# Patient Record
Sex: Female | Born: 1955 | Race: Black or African American | Hispanic: No | Marital: Single | Smoking: Never smoker
Health system: Southern US, Community
[De-identification: ages and names within clinical notes are randomized; demographics above are authoritative.]

## PROBLEM LIST (undated history)

## (undated) DIAGNOSIS — E119 Type 2 diabetes mellitus without complications: Secondary | ICD-10-CM

## (undated) HISTORY — DX: Type 2 diabetes mellitus without complications: E11.9

## (undated) HISTORY — PX: BREAST BIOPSY: SHX20

## (undated) HISTORY — PX: TONSILLECTOMY: SUR1361

---

## 2002-06-07 HISTORY — PX: BREAST BIOPSY: SHX20

## 2004-10-22 ENCOUNTER — Ambulatory Visit: Payer: Self-pay | Admitting: Obstetrics and Gynecology

## 2005-11-04 ENCOUNTER — Ambulatory Visit: Payer: Self-pay | Admitting: Obstetrics and Gynecology

## 2006-10-20 ENCOUNTER — Ambulatory Visit: Payer: Self-pay | Admitting: Internal Medicine

## 2007-11-14 ENCOUNTER — Ambulatory Visit: Payer: Self-pay | Admitting: Obstetrics and Gynecology

## 2008-09-12 ENCOUNTER — Ambulatory Visit: Payer: Self-pay | Admitting: Internal Medicine

## 2008-09-16 ENCOUNTER — Ambulatory Visit: Payer: Self-pay | Admitting: Internal Medicine

## 2008-11-15 ENCOUNTER — Ambulatory Visit: Payer: Self-pay | Admitting: Obstetrics and Gynecology

## 2009-11-18 ENCOUNTER — Ambulatory Visit: Payer: Self-pay | Admitting: Obstetrics and Gynecology

## 2012-01-25 ENCOUNTER — Ambulatory Visit: Payer: Self-pay | Admitting: Obstetrics and Gynecology

## 2013-02-20 ENCOUNTER — Ambulatory Visit: Payer: Self-pay | Admitting: Obstetrics and Gynecology

## 2014-03-11 ENCOUNTER — Ambulatory Visit: Payer: Self-pay | Admitting: Obstetrics and Gynecology

## 2014-03-15 ENCOUNTER — Ambulatory Visit: Payer: Self-pay | Admitting: Obstetrics and Gynecology

## 2014-09-25 ENCOUNTER — Ambulatory Visit
Admit: 2014-09-25 | Disposition: A | Discharge status: Home / Self Care | Payer: Self-pay | Attending: Obstetrics and Gynecology | Admitting: Obstetrics and Gynecology

## 2015-03-26 ENCOUNTER — Other Ambulatory Visit: Payer: Self-pay | Admitting: Obstetrics and Gynecology

## 2015-03-26 DIAGNOSIS — R928 Other abnormal and inconclusive findings on diagnostic imaging of breast: Secondary | ICD-10-CM

## 2015-05-21 ENCOUNTER — Ambulatory Visit: Payer: Self-pay

## 2015-05-21 ENCOUNTER — Other Ambulatory Visit: Payer: Self-pay

## 2015-05-29 ENCOUNTER — Ambulatory Visit
Admission: RE | Admit: 2015-05-29 | Discharge: 2015-05-29 | Disposition: A | Discharge status: Home / Self Care | Payer: 59 | Source: Ambulatory Visit | Attending: Obstetrics and Gynecology | Admitting: Obstetrics and Gynecology

## 2015-05-29 ENCOUNTER — Other Ambulatory Visit: Payer: Self-pay | Admitting: Obstetrics and Gynecology

## 2015-05-29 DIAGNOSIS — R928 Other abnormal and inconclusive findings on diagnostic imaging of breast: Secondary | ICD-10-CM

## 2015-06-24 ENCOUNTER — Other Ambulatory Visit: Payer: Self-pay | Admitting: Internal Medicine

## 2015-06-24 DIAGNOSIS — R748 Abnormal levels of other serum enzymes: Secondary | ICD-10-CM

## 2015-07-07 ENCOUNTER — Ambulatory Visit
Admission: RE | Admit: 2015-07-07 | Discharge: 2015-07-07 | Disposition: A | Discharge status: Home / Self Care | Payer: 59 | Source: Ambulatory Visit | Attending: Internal Medicine | Admitting: Internal Medicine

## 2015-07-07 DIAGNOSIS — R748 Abnormal levels of other serum enzymes: Secondary | ICD-10-CM | POA: Insufficient documentation

## 2016-04-26 ENCOUNTER — Other Ambulatory Visit: Payer: Self-pay | Admitting: Obstetrics and Gynecology

## 2016-04-26 DIAGNOSIS — Z1231 Encounter for screening mammogram for malignant neoplasm of breast: Secondary | ICD-10-CM

## 2016-08-09 ENCOUNTER — Ambulatory Visit
Admission: RE | Admit: 2016-08-09 | Discharge: 2016-08-09 | Disposition: A | Discharge status: Home / Self Care | Payer: 59 | Source: Ambulatory Visit | Attending: Obstetrics and Gynecology | Admitting: Obstetrics and Gynecology

## 2016-08-09 DIAGNOSIS — Z1231 Encounter for screening mammogram for malignant neoplasm of breast: Secondary | ICD-10-CM | POA: Insufficient documentation

## 2017-04-26 ENCOUNTER — Other Ambulatory Visit: Payer: Self-pay | Admitting: Obstetrics and Gynecology

## 2017-04-26 DIAGNOSIS — Z1231 Encounter for screening mammogram for malignant neoplasm of breast: Secondary | ICD-10-CM

## 2017-08-10 ENCOUNTER — Ambulatory Visit
Admission: RE | Admit: 2017-08-10 | Discharge: 2017-08-10 | Disposition: A | Discharge status: Home / Self Care | Payer: 59 | Source: Ambulatory Visit | Attending: Obstetrics and Gynecology | Admitting: Obstetrics and Gynecology

## 2017-08-10 DIAGNOSIS — Z1231 Encounter for screening mammogram for malignant neoplasm of breast: Secondary | ICD-10-CM | POA: Insufficient documentation

## 2018-06-13 ENCOUNTER — Encounter: Payer: Self-pay | Admitting: *Deleted

## 2019-07-26 ENCOUNTER — Ambulatory Visit: Payer: 59

## 2019-07-30 ENCOUNTER — Ambulatory Visit: Payer: 59 | Attending: Family

## 2019-07-30 DIAGNOSIS — Z23 Encounter for immunization: Secondary | ICD-10-CM

## 2019-07-30 NOTE — Progress Notes (Signed)
   Covid-19 Vaccination Clinic  Name:  Tracy Pacheco    MRN: [id number] DOB: 1955-09-30  07/30/2019  Tracy Pacheco was observed post Covid-19 immunization for 15 minutes without incidence. She was provided with Vaccine Information Sheet and instruction to access the V-Safe system.   Tracy Pacheco was instructed to call 911 with any severe reactions post vaccine: Marland Kitchen Difficulty breathing  . Swelling of your face and throat  . A fast heartbeat  . A bad rash all over your body  . Dizziness and weakness    Immunizations Administered    Name Date Dose VIS Date Route   Moderna COVID-19 Vaccine 07/30/2019  3:45 PM 0.5 mL 05/08/2019 Intramuscular   Manufacturer: Moderna   Lot: [id number]   NDC: 41991-444-58

## 2019-09-11 ENCOUNTER — Ambulatory Visit: Payer: 59 | Attending: Family

## 2019-09-11 DIAGNOSIS — Z23 Encounter for immunization: Secondary | ICD-10-CM

## 2019-09-11 NOTE — Progress Notes (Signed)
   Covid-19 Vaccination Clinic  Name:  Tracy Pacheco    MRN: [id number] DOB: 1956/01/21  09/11/2019  Tracy Pacheco was observed post Covid-19 immunization for 15 minutes without incident. She was provided with Vaccine Information Sheet and instruction to access the V-Safe system.   Tracy Pacheco was instructed to call 911 with any severe reactions post vaccine: Marland Kitchen Difficulty breathing  . Swelling of face and throat  . A fast heartbeat  . A bad rash all over body  . Dizziness and weakness   Immunizations Administered    Name Date Dose VIS Date Route   Moderna COVID-19 Vaccine 09/11/2019  2:47 PM 0.5 mL 05/08/2019 Intramuscular   Manufacturer: Moderna   Lot: [id number]   NDC: 70488-891-69

## 2019-09-26 DIAGNOSIS — M1711 Unilateral primary osteoarthritis, right knee: Secondary | ICD-10-CM | POA: Insufficient documentation

## 2019-09-26 DIAGNOSIS — Z6841 Body Mass Index (BMI) 40.0 and over, adult: Secondary | ICD-10-CM | POA: Insufficient documentation

## 2019-11-28 ENCOUNTER — Other Ambulatory Visit: Payer: Self-pay | Admitting: Internal Medicine

## 2019-11-28 DIAGNOSIS — Z1231 Encounter for screening mammogram for malignant neoplasm of breast: Secondary | ICD-10-CM

## 2019-12-05 ENCOUNTER — Ambulatory Visit
Admission: RE | Admit: 2019-12-05 | Discharge: 2019-12-05 | Disposition: A | Discharge status: Home / Self Care | Payer: 59 | Source: Ambulatory Visit | Attending: Internal Medicine | Admitting: Internal Medicine

## 2019-12-05 DIAGNOSIS — Z1231 Encounter for screening mammogram for malignant neoplasm of breast: Secondary | ICD-10-CM | POA: Diagnosis not present

## 2020-04-18 ENCOUNTER — Ambulatory Visit: Payer: 59

## 2020-04-18 ENCOUNTER — Ambulatory Visit: Payer: Self-pay

## 2020-04-21 ENCOUNTER — Ambulatory Visit: Payer: 59 | Attending: Internal Medicine

## 2020-04-21 DIAGNOSIS — Z23 Encounter for immunization: Secondary | ICD-10-CM

## 2020-04-21 NOTE — Progress Notes (Signed)
   Covid-19 Vaccination Clinic  Name:  Tracy Pacheco    MRN: [id number] DOB: 06/26/1955  04/21/2020  Tracy Pacheco was observed post Covid-19 immunization for 15 minutes without incident. She was provided with Vaccine Information Sheet and instruction to access the V-Safe system.   Tracy Pacheco was instructed to call 911 with any severe reactions post vaccine: Marland Kitchen Difficulty breathing  . Swelling of face and throat  . A fast heartbeat  . A bad rash all over body  . Dizziness and weakness   Immunizations Administered    No immunizations on file.

## 2020-05-20 ENCOUNTER — Ambulatory Visit (INDEPENDENT_AMBULATORY_CARE_PROVIDER_SITE_OTHER): Payer: 59 | Admitting: General Surgery

## 2020-05-20 ENCOUNTER — Encounter: Payer: Self-pay | Admitting: General Surgery

## 2020-05-20 ENCOUNTER — Other Ambulatory Visit: Payer: Self-pay

## 2020-05-20 VITALS — BP 111/80 | HR 98 | Temp 98.2°F | Ht 67.0 in | Wt 334.0 lb

## 2020-05-20 DIAGNOSIS — L723 Sebaceous cyst: Secondary | ICD-10-CM | POA: Insufficient documentation

## 2020-05-20 NOTE — Patient Instructions (Signed)
We have you scheduled to have cyst removed from chin. See appointment below. Please remember to bring a list of medications to next appointment.

## 2020-05-20 NOTE — Progress Notes (Signed)
Patient ID: Tracy Pacheco, female   DOB: 1956/04/17, 64 y.o.   MRN: [id number]  Chief Complaint  Patient presents with  . New Patient (Initial Visit)    Infected cyst on chin/neck    HPI Tracy Pacheco is a 64 y.o. female.   She has been referred by her primary care provider for further evaluation of a cyst on her chin.  She says that it has been present for about a year.  It will occasionally drain clear fluid, occasionally serosanguineous fluid, and on some occasions material that looks like pus.  She took antibiotics but it did not resolve.  It is tender to the touch and the discomfort is localized just to the area of the cyst.  She denies any fevers or chills.  No nausea or vomiting.  She has had a cyst taken off her shoulder in the past but does not currently have any other cystic areas on her body.   Past Medical History:  Diagnosis Date  . Diabetes mellitus without complication [facility])     Past Surgical History:  Procedure Laterality Date  . TONSILLECTOMY      Family History  Problem Relation Age of Onset  . Cancer Father   . Breast cancer Neg Hx     Social History Social History   Tobacco Use  . Smoking status: Never Smoker  . Smokeless tobacco: Never Used  Substance Use Topics  . Alcohol use: Never  . Drug use: Never    Not on File  Current Outpatient Medications  Medication Sig Dispense Refill  . cetirizine (ZYRTEC) 5 MG tablet Take 5 mg by mouth daily.    Marland Kitchen lisinopril (ZESTRIL) 10 MG tablet Take 10 mg by mouth daily.    . metFORMIN (GLUCOPHAGE) 1000 MG tablet Take 1,000 mg by mouth 2 (two) times daily with a meal.    . Multiple Vitamin (MULTIVITAMIN) tablet Take 1 tablet by mouth daily.    . saxagliptin HCl (ONGLYZA) 5 MG TABS tablet Take by mouth daily.     No current facility-administered medications for this visit.    Review of Systems Review of Systems  All other systems reviewed and are negative.   Blood pressure 111/80, pulse 98, temperature  98.2 F (36.8 C), temperature source Oral, height 5\' 7"  (1.702 m), weight (!) 334 lb (151.5 kg), SpO2 95 %.  Physical Exam Physical Exam Vitals reviewed.  Constitutional:      General: She is not in acute distress.    Appearance: She is obese.  HENT:     Head: Normocephalic and atraumatic.      Comments: On the right side of her chin, there is an area of soft tissue swelling with a punctum on the caudal aspect.  She says this is where it drains.  It is currently not erythematous or indurated.  With palpation, I think that I can feel a contained cyst.    Nose: Nose normal.     Mouth/Throat:     Mouth: Mucous membranes are moist.     Pharynx: Oropharynx is clear.  Eyes:     General:        Right eye: No discharge.        Left eye: No discharge.  Cardiovascular:     Rate and Rhythm: Normal rate and regular rhythm.  Pulmonary:     Effort: Pulmonary effort is normal.     Breath sounds: Normal breath sounds.  Abdominal:     General: Bowel sounds are normal.  Palpations: Abdomen is soft.  Genitourinary:    Comments: Deferred Musculoskeletal:     Right lower leg: Edema present.     Left lower leg: Edema present.     Comments: 2+ nonpitting edema.  Skin:    General: Skin is warm and dry.  Neurological:     General: No focal deficit present.     Mental Status: She is alert and oriented to person, place, and time.  Psychiatric:        Mood and Affect: Mood normal.        Behavior: Behavior normal.     Data Reviewed There are no relevant outside records available for me to review.  It does appear that she has been seen by orthopedics for osteoarthritis and by podiatry for plantar fasciitis.  Unfortunately, her primary care provider does not have records accessible within epic and none were provided via fax.  Assessment This is a 64 year old woman with what appears to be a sebaceous cyst on her chin.  It has been present for a year and has not resolved with conservative  treatment.  She is interested in having it removed.  Plan I have offered her the options of an in office procedure versus 1 done in the operating room.  She would like to have it done here in clinic.  The risks of the procedure were discussed with her.  These include, but are not limited to, bleeding, infection, scarring, pain, and cyst recurrence.  She will return to clinic next week for dedicated procedure only visit.    Tracy Pacheco 05/20/2020, 10:35 AM

## 2020-05-27 ENCOUNTER — Ambulatory Visit (INDEPENDENT_AMBULATORY_CARE_PROVIDER_SITE_OTHER): Payer: 59 | Admitting: General Surgery

## 2020-05-27 ENCOUNTER — Other Ambulatory Visit: Payer: Self-pay

## 2020-05-27 ENCOUNTER — Encounter: Payer: Self-pay | Admitting: General Surgery

## 2020-05-27 VITALS — BP 154/90 | HR 105 | Temp 98.3°F | Ht 67.0 in | Wt 333.2 lb

## 2020-05-27 DIAGNOSIS — L723 Sebaceous cyst: Secondary | ICD-10-CM | POA: Diagnosis not present

## 2020-05-27 NOTE — Patient Instructions (Addendum)
Patient advised to take Ibuprofen and Tylenol or use Cold Compress (ice pack) to help with inflammation. Patient advised to avoid submerging the wound in pools of water, patient advised to pat around the wound. Steri Strips was applied to the wound at today's visit, please allow up to 10 days for them to fall off. Patient will follow up with in 2 weeks for a wound check with Dr Lady Gary.   Epidermal Cyst Removal, Care After This sheet gives you information about how to care for yourself after your procedure. Your health care provider may also give you more specific instructions. If you have problems or questions, contact your health care provider. What can I expect after the procedure? After the procedure, it is common to have:  Soreness in the area where your cyst was removed.  Tightness or itchiness from the stitches (sutures) in your skin. Follow these instructions at home: Medicines  Take over-the-counter and prescription medicines only as told by your health care provider.  If you were prescribed an antibiotic medicine or ointment, take or apply it as told by your health care provider. Do not stop using the antibiotic even if you start to feel better. Incision care   Follow instructions from your health care provider about how to take care of your incision. Make sure you: ? Wash your hands with soap and water before you change your bandage (dressing). If soap and water are not available, use hand sanitizer. ? Change your dressing as told by your health care provider. ? Leave sutures, skin glue, or adhesive strips in place. These skin closures may need to stay in place for 1-2 weeks or longer. If adhesive strip edges start to loosen and curl up, you may trim the loose edges. Do not remove adhesive strips completely unless your health care provider tells you to do that.  Keep the dressingdry until your health care provider says that it can be removed.  After your dressing is off,  check your incision area every day for signs of infection. Check for: ? Redness, swelling, or pain. ? Fluid or blood. ? Warmth. ? Pus or a bad smell. General instructions  Do not take baths, swim, or use a hot tub until your health care provider approves. Ask your health care provider if you may take showers. You may only be allowed to take sponge baths.  Your health care provider may ask you to avoid contact sports or activities that take a lot of effort. Do not do anything that stretches or puts pressure on your incision.  You can return to your normal diet.  Keep all follow-up visits as told by your health care provider. This is important. Contact a health care provider if:  You have a fever.  You have redness, swelling, or pain in the incision area.  You have fluid or blood coming from your incision.  You have pus or a bad smell coming from your incision.  Your incision feels warm to the touch.  Your cyst grows back. Summary  After the procedure, it is common to have soreness in the area where your cyst was removed.  Take or apply over-the-counter and prescription medicines only as told by your health care provider.  Follow instructions from your health care provider about how to take care of your incision. This information is not intended to replace advice given to you by your health care provider. Make sure you discuss any questions you have with your health care provider. Document Revised:  09/13/2017 Document Reviewed: 03/17/2017 Elsevier Patient Education  2020 [employer]  Wound Care, Adult Taking care of your wound properly can help to prevent pain, infection, and scarring. It can also help your wound to heal more quickly. How to care for your wound Wound care      Follow instructions from your health care provider about how to take care of your wound. Make sure you: ? Wash your hands with soap and water before you change the bandage (dressing). If soap and  water are not available, use hand sanitizer. ? Change your dressing as told by your health care provider. ? Leave stitches (sutures), skin glue, or adhesive strips in place. These skin closures may need to stay in place for 2 weeks or longer. If adhesive strip edges start to loosen and curl up, you may trim the loose edges. Do not remove adhesive strips completely unless your health care provider tells you to do that.  Check your wound area every day for signs of infection. Check for: ? Redness, swelling, or pain. ? Fluid or blood. ? Warmth. ? Pus or a bad smell.  Ask your health care provider if you should clean the wound with mild soap and water. Doing this may include: ? Using a clean towel to pat the wound dry after cleaning it. Do not rub or scrub the wound. ? Applying a cream or ointment. Do this only as told by your health care provider. ? Covering the incision with a clean dressing.  Ask your health care provider when you can leave the wound uncovered.  Keep the dressing dry until your health care provider says it can be removed. Do not take baths, swim, use a hot tub, or do anything that would put the wound underwater until your health care provider approves. Ask your health care provider if you can take showers. You may only be allowed to take sponge baths. Medicines   If you were prescribed an antibiotic medicine, cream, or ointment, take or use the antibiotic as told by your health care provider. Do not stop taking or using the antibiotic even if your condition improves.  Take over-the-counter and prescription medicines only as told by your health care provider. If you were prescribed pain medicine, take it 30 or more minutes before you do any wound care or as told by your health care provider. General instructions  Return to your normal activities as told by your health care provider. Ask your health care provider what activities are safe.  Do not scratch or pick at the  wound.  Do not use any products that contain nicotine or tobacco, such as cigarettes and e-cigarettes. These may delay wound healing. If you need help quitting, ask your health care provider.  Keep all follow-up visits as told by your health care provider. This is important.  Eat a diet that includes protein, vitamin A, vitamin C, and other nutrient-rich foods to help the wound heal. ? Foods rich in protein include meat, dairy, beans, nuts, and other sources. ? Foods rich in vitamin A include carrots and dark green, leafy vegetables. ? Foods rich in vitamin C include citrus, tomatoes, and other fruits and vegetables. ? Nutrient-rich foods have protein, carbohydrates, fat, vitamins, or minerals. Eat a variety of healthy foods including vegetables, fruits, and whole grains. Contact a health care provider if:  You received a tetanus shot and you have swelling, severe pain, redness, or bleeding at the injection site.  Your pain is not controlled  with medicine.  You have redness, swelling, or pain around the wound.  You have fluid or blood coming from the wound.  Your wound feels warm to the touch.  You have pus or a bad smell coming from the wound.  You have a fever or chills.  You are nauseous or you vomit.  You are dizzy. Get help right away if:  You have a red streak going away from your wound.  The edges of the wound open up and separate.  Your wound is bleeding, and the bleeding does not stop with gentle pressure.  You have a rash.  You faint.  You have trouble breathing. Summary  Always wash your hands with soap and water before changing your bandage (dressing).  To help with healing, eat foods that are rich in protein, vitamin A, vitamin C, and other nutrients.  Check your wound every day for signs of infection. Contact your health care provider if you suspect that your wound is infected. This information is not intended to replace advice given to you by your  health care provider. Make sure you discuss any questions you have with your health care provider. Document Revised: 09/11/2018 Document Reviewed: 12/09/2015 Elsevier Patient Education  2020 [employer].

## 2020-05-27 NOTE — Progress Notes (Signed)
Sebaceous Cyst Excision Procedure Note  Pre-operative Diagnosis: sebaceous cyst  Post-operative Diagnosis: same; 0.4cm  Locations: chin   Indications: chronically draining sebaceous cyst  Anesthesia: Lidocaine 1% with epinephrine without added sodium bicarbonate  Procedure Details  History of allergy to iodine: no  Patient informed of the risks (including bleeding and infection) and benefits of the  procedure and Written informed consent obtained.  The lesion and surrounding area was given a sterile prep using chlorhexidine and draped in the usual sterile fashion. An incision was made over the cyst, which was dissected free of the surrounding tissue and removed.  The cyst was filled with typical sebaceous material.  The wound was closed with 3-0 Vicryl using simple interrupted stitches in the deep dermis and running subcuticular 4-0 monocryl in the skin. Dermabond and Steri-Strips applied.  The specimen was not sent for pathologic examination. The patient tolerated the procedure well.  EBL: <5 ml  Findings: Deep sebaceous cyst  Condition: Stable  Complications: none.  Plan: 1. Instructed to keep the wound dry and covered for 24-48h and clean thereafter. 2. Warning signs of infection were reviewed.   3. Recommended that the patient use OTC analgesics as needed for pain.  4. Return for wound check in 2 weeks.

## 2020-06-10 ENCOUNTER — Other Ambulatory Visit: Payer: Self-pay

## 2020-06-10 ENCOUNTER — Ambulatory Visit (INDEPENDENT_AMBULATORY_CARE_PROVIDER_SITE_OTHER): Payer: 59 | Admitting: General Surgery

## 2020-06-10 ENCOUNTER — Encounter: Payer: Self-pay | Admitting: General Surgery

## 2020-06-10 VITALS — BP 161/97 | HR 94 | Temp 98.0°F | Ht 66.0 in | Wt 340.0 lb

## 2020-06-10 DIAGNOSIS — E119 Type 2 diabetes mellitus without complications: Secondary | ICD-10-CM | POA: Insufficient documentation

## 2020-06-10 DIAGNOSIS — L723 Sebaceous cyst: Secondary | ICD-10-CM

## 2020-06-10 NOTE — Patient Instructions (Signed)
Please call our office if you have questions or concerns.   

## 2020-06-10 NOTE — Progress Notes (Signed)
Tracy Pacheco is here today for a wound check.  I excised a epidermal inclusion cyst from her chin on December 21.  She says that she has been doing well.  She denies any fevers or chills.  No concern for pain.  She says there is been a small amount of serous drainage at the incision site.  Today's Vitals   06/10/20 1117  BP: (!) 161/97  Pulse: 94  Temp: 98 F (36.7 C)  TempSrc: Oral  SpO2: 95%  Weight: (!) 340 lb (154.2 kg)  Height: 5\' 6"  (1.676 m)   Body mass index is 54.88 kg/m. Focused examination demonstrates a well approximated incision with a tiny punctate opening near where the sinus tract had been.  There is no concern for infection.  No active drainage at this time.  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  It has been excised.  I reassured her that the small skin opening would close and that she could use gauze or a Band-Aid to prevent any dripping, should it occur.  I will see her on an as-needed basis.

## 2020-11-26 ENCOUNTER — Other Ambulatory Visit: Payer: Self-pay | Admitting: Internal Medicine

## 2020-11-26 DIAGNOSIS — Z1231 Encounter for screening mammogram for malignant neoplasm of breast: Secondary | ICD-10-CM

## 2020-12-05 ENCOUNTER — Other Ambulatory Visit: Payer: Self-pay

## 2020-12-05 ENCOUNTER — Ambulatory Visit
Admission: RE | Admit: 2020-12-05 | Discharge: 2020-12-05 | Disposition: A | Discharge status: Home / Self Care | Payer: 59 | Source: Ambulatory Visit | Attending: Internal Medicine | Admitting: Internal Medicine

## 2020-12-05 DIAGNOSIS — Z1231 Encounter for screening mammogram for malignant neoplasm of breast: Secondary | ICD-10-CM | POA: Diagnosis not present

## 2021-03-23 ENCOUNTER — Encounter: Payer: Self-pay | Admitting: General Surgery

## 2021-11-17 ENCOUNTER — Other Ambulatory Visit: Payer: Self-pay | Admitting: Internal Medicine

## 2021-11-17 DIAGNOSIS — Z1231 Encounter for screening mammogram for malignant neoplasm of breast: Secondary | ICD-10-CM

## 2021-11-25 ENCOUNTER — Ambulatory Visit (INDEPENDENT_AMBULATORY_CARE_PROVIDER_SITE_OTHER): Payer: 59

## 2021-11-25 ENCOUNTER — Ambulatory Visit
Admission: RE | Admit: 2021-11-25 | Discharge: 2021-11-25 | Disposition: A | Discharge status: Home / Self Care | Payer: 59 | Source: Ambulatory Visit | Attending: Emergency Medicine | Admitting: Emergency Medicine

## 2021-11-25 VITALS — BP 100/69 | HR 118 | Temp 100.5°F | Resp 18

## 2021-11-25 DIAGNOSIS — R197 Diarrhea, unspecified: Secondary | ICD-10-CM | POA: Diagnosis not present

## 2021-11-25 DIAGNOSIS — R052 Subacute cough: Secondary | ICD-10-CM | POA: Diagnosis not present

## 2021-11-25 DIAGNOSIS — R059 Cough, unspecified: Secondary | ICD-10-CM

## 2021-11-25 MED ORDER — PROMETHAZINE-DM 6.25-15 MG/5ML PO SYRP: 5.0000 mL | ORAL_SOLUTION | Freq: Four times a day (QID) | ORAL | 0 refills | Status: DC | PRN

## 2021-11-25 MED ORDER — ACETAMINOPHEN 500 MG PO TABS: 1000.0000 mg | ORAL_TABLET | Freq: Once | ORAL | Status: DC

## 2021-11-25 MED ORDER — BENZONATATE 100 MG PO CAPS: 200.0000 mg | ORAL_CAPSULE | Freq: Three times a day (TID) | ORAL | 0 refills | Status: DC

## 2021-11-25 MED ORDER — ACETAMINOPHEN 325 MG PO TABS
650.0000 mg | ORAL_TABLET | Freq: Once | ORAL | Status: AC
  Administered 2021-11-25: 650 mg via ORAL

## 2021-11-25 NOTE — ED Provider Notes (Signed)
[facility]    CSN: [id number] Arrival date & time: 11/25/21  1240      History   Chief Complaint Chief Complaint  Patient presents with   Diarrhea    Cough, chills - Entered by patient   Chills    HPI Tracy Pacheco is a 66 y.o. female.   HPI  66 year old female here for evaluation of multiple complaints.  Patient reports that she has been experiencing a cough for the last 2 weeks that had been dry but is now starting to have some yellow mucus production.  This is associated with some mild nasal congestion but she denies any nasal discharge, sore throat, or ear pain.  5 days ago she developed diarrhea and chills.  This is not associated with any nausea or vomiting.  She has had a decreased appetite.  Patient also denies any shortness of breath or wheezing.  She did take a home COVID test which was negative.  She denies any abdominal pain.  Past Medical History:  Diagnosis Date   Diabetes mellitus without complication [facility])     Patient Active Problem List   Diagnosis Date Noted   Diabetes mellitus type 2, uncomplicated ([place]) 123XX123   Sebaceous cyst 05/20/2020   Morbid obesity with BMI of 50.0-59.9, adult ([place]) 09/26/2019   Primary osteoarthritis of right knee 09/26/2019    Past Surgical History:  Procedure Laterality Date   TONSILLECTOMY      OB History   No obstetric history on file.      Home Medications    Prior to Admission medications   Medication Sig Start Date End Date Taking? Authorizing Provider  benzonatate (TESSALON) 100 MG capsule Take 2 capsules (200 mg total) by mouth every 8 (eight) hours. 11/25/21  Yes Margarette Canada, NP  promethazine-dextromethorphan (PROMETHAZINE-DM) 6.25-15 MG/5ML syrup Take 5 mLs by mouth 4 (four) times daily as needed. 11/25/21  Yes Margarette Canada, NP  atorvastatin (LIPITOR) 10 MG tablet Take 10 mg by mouth daily. 05/06/20   [provider]  cetirizine (ZYRTEC) 5 MG tablet Take 5 mg by mouth daily.     [provider]  Cholecalciferol 25 MCG (1000 UT) tablet Take by mouth.    [provider]  hydrochlorothiazide (HYDRODIURIL) 25 MG tablet Take 25 mg by mouth daily. 04/14/20   [provider]  lisinopril (ZESTRIL) 10 MG tablet Take 20 mg by mouth daily.    [provider]  meloxicam (MOBIC) 15 MG tablet Take 15 mg by mouth daily. 05/16/20   [provider]  metFORMIN (GLUCOPHAGE) 1000 MG tablet Take 1,000 mg by mouth 2 (two) times daily with a meal.    [provider]  Multiple Vitamin (MULTIVITAMIN) tablet Take 1 tablet by mouth daily.    [provider]  OneTouch Delica Lancets [id number] MISC  04/17/20   [provider]  [facility] VERIO test strip 1 each daily. 03/31/20   [provider]  saxagliptin HCl (ONGLYZA) 5 MG TABS tablet Take by mouth daily.    [provider]    Family History Family History  Problem Relation Age of Onset   Cancer Father    Breast cancer Neg Hx     Social History Social History   Tobacco Use   Smoking status: Never   Smokeless tobacco: Never  Substance Use Topics   Alcohol use: Never   Drug use: Never     Allergies   Patient has no known allergies.   Review of Systems  Review of Systems  Constitutional:  Positive for chills. Negative for fever.  HENT:  Positive for congestion. Negative for ear pain, rhinorrhea and sore throat.   Respiratory:  Positive for cough. Negative for shortness of breath and wheezing.   Gastrointestinal:  Positive for diarrhea. Negative for abdominal pain, nausea and vomiting.  Skin:  Negative for rash.  Hematological: Negative.   Psychiatric/Behavioral: Negative.       Physical Exam Triage Vital Signs ED Triage Vitals  Enc Vitals Group     BP      Pulse      Resp      Temp      Temp src      SpO2      Weight      Height      Head Circumference      Peak Flow      Pain Score      Pain Loc      Pain Edu?      Excl. in  GC?    No data found.  Updated Vital Signs BP 100/69 (BP Location: Right Arm)   Pulse (!) 118   Temp (!) 100.5 F (38.1 C) (Oral)   Resp 18   SpO2 94%   Visual Acuity Right Eye Distance:   Left Eye Distance:   Bilateral Distance:    Right Eye Near:   Left Eye Near:    Bilateral Near:     Physical Exam Vitals and nursing note reviewed.  Constitutional:      Appearance: Normal appearance. She is not ill-appearing.  HENT:     Head: Normocephalic and atraumatic.     Right Ear: Tympanic membrane, ear canal and external ear normal. There is no impacted cerumen.     Left Ear: Tympanic membrane, ear canal and external ear normal. There is no impacted cerumen.     Nose: Nose normal. No congestion or rhinorrhea.     Mouth/Throat:     Mouth: Mucous membranes are moist.     Pharynx: Oropharynx is clear. No oropharyngeal exudate or posterior oropharyngeal erythema.  Cardiovascular:     Rate and Rhythm: Normal rate and regular rhythm.     Pulses: Normal pulses.     Heart sounds: Normal heart sounds. No murmur heard.    No friction rub. No gallop.  Pulmonary:     Effort: Pulmonary effort is normal.     Breath sounds: Normal breath sounds. No wheezing, rhonchi or rales.  Musculoskeletal:     Cervical back: Normal range of motion and neck supple.  Lymphadenopathy:     Cervical: No cervical adenopathy.  Skin:    General: Skin is warm and dry.     Capillary Refill: Capillary refill takes less than 2 seconds.     Findings: No erythema or rash.  Neurological:     General: No focal deficit present.     Mental Status: She is alert and oriented to person, place, and time.  Psychiatric:        Mood and Affect: Mood normal.        Behavior: Behavior normal.        Thought Content: Thought content normal.        Judgment: Judgment normal.      UC Treatments / Results  Labs (all labs ordered are listed, but only abnormal results are displayed) Labs Reviewed - No data to  display  EKG   Radiology DG Chest 2 View  Result Date: 11/25/2021  CLINICAL DATA:  Productive cough EXAM: CHEST - 2 VIEW COMPARISON:  None Available. FINDINGS: The heart size and mediastinal contours are within normal limits. Both lungs are clear. The visualized skeletal structures are unremarkable. IMPRESSION: No active cardiopulmonary disease. Electronically Signed   By: Ernie Avena M.D.   On: 11/25/2021 13:33    Procedures Procedures (including critical care time)  Medications Ordered in UC Medications  acetaminophen (TYLENOL) tablet 1,000 mg (1,000 mg Oral Not Given 11/25/21 1323)  acetaminophen (TYLENOL) tablet 650 mg (650 mg Oral Given 11/25/21 1323)    Initial Impression / Assessment and Plan / UC Course  I have reviewed the triage vital signs and the nursing notes.  Pertinent labs & imaging results that were available during my care of the patient were reviewed by me and considered in my medical decision making (see chart for details).  Patient is a nontoxic-appearing 66 year old female here for evaluation of cough, diarrhea, and chills as outlined in HPI above.  Patient reports that she has been checking at home but has not registered a fever.  Today in clinic she is 100.5.  She has had a nonproductive cough for 2 weeks that has in the last 3 days turned into a productive cough for yellow sputum.  No associated shortness of breath or wheezing.  Patient also denies any other upper respiratory symptoms.  5 days ago she developed diarrhea and chills along with a decreased appetite.  She states when she does eat something it goes right through her and she has to have a bowel movement within a few minutes of eating.  This is not associated with abdominal pain, nausea, or vomiting.  On exam patient has pearly-gray tympanic membranes bilaterally with normal light reflex and clear external auditory canals.  Nasal mucosa is pink and moist without erythema, edema, or discharge.   Oropharyngeal exam is benign.  No cervical of adenopathy appreciable exam.  Cardiopulmonary exam reveals clear lung sounds in all fields.  I will obtain chest x-ray to look for the presence of pneumonia.  I am not going to swab the patient for COVID or influenza as she is outside the window for treatment of either.  Radiology impression of chest x-ray states negative for acute cardiopulmonary process.  I reexamined the patient and patient Isabella Stalling that she is having 6-8 diarrheal stools a day but denies any blood.  No belly pain.  Patient's abdomen is protuberant but is soft and nontender to touch.  I will discharge patient home with Tessalon Perles and Promethazine DM cough syrup to help with cough during the day and at night and have her use over-the-counter Imodium as needed for the diarrhea.  Any new or worsening symptoms patient should return for reevaluation.   Final Clinical Impressions(s) / UC Diagnoses   Final diagnoses:  Subacute cough  Diarrhea, unspecified type     Discharge Instructions      Your chest x-ray today did not demonstrate the presence of any pneumonia.  I believe your diarrhea is coming as a result of an infectious process in your body but I do not have an etiology for that.  Use the Tessalon Perles every hour during the day as needed for cough and use the Promethazine DM cough syrup at bedtime.  Take over-the-counter Imodium according to package instructions as needed for diarrhea.  If you develop any new or worsening symptoms please return for reevaluation or see your primary care provider.     ED Prescriptions  Medication Sig Dispense Auth. Provider   benzonatate (TESSALON) 100 MG capsule Take 2 capsules (200 mg total) by mouth every 8 (eight) hours. 21 capsule Margarette Canada, NP   promethazine-dextromethorphan (PROMETHAZINE-DM) 6.25-15 MG/5ML syrup Take 5 mLs by mouth 4 (four) times daily as needed. 118 mL Margarette Canada, NP      PDMP not reviewed this  encounter.   Margarette Canada, NP 11/25/21 1343

## 2021-11-25 NOTE — Discharge Instructions (Addendum)
Your chest x-ray today did not demonstrate the presence of any pneumonia.  I believe your diarrhea is coming as a result of an infectious process in your body but I do not have an etiology for that.  Use the Tessalon Perles every hour during the day as needed for cough and use the Promethazine DM cough syrup at bedtime.  Take over-the-counter Imodium according to package instructions as needed for diarrhea.  If you develop any new or worsening symptoms please return for reevaluation or see your primary care provider.

## 2021-11-25 NOTE — ED Triage Notes (Signed)
Pt reports a cough for a couple weeks, diarrhea since Friday and chills since Friday night. States tried pepto bismal and imodium with no relief. Took home covid test and it was neg.

## 2021-12-11 ENCOUNTER — Ambulatory Visit
Admission: RE | Admit: 2021-12-11 | Discharge: 2021-12-11 | Disposition: A | Discharge status: Home / Self Care | Payer: 59 | Source: Ambulatory Visit | Attending: Internal Medicine | Admitting: Internal Medicine

## 2021-12-11 DIAGNOSIS — Z1231 Encounter for screening mammogram for malignant neoplasm of breast: Secondary | ICD-10-CM | POA: Diagnosis present

## 2022-03-05 ENCOUNTER — Other Ambulatory Visit: Payer: Self-pay | Admitting: Gastroenterology

## 2022-03-05 DIAGNOSIS — R748 Abnormal levels of other serum enzymes: Secondary | ICD-10-CM

## 2022-03-10 ENCOUNTER — Ambulatory Visit
Admission: RE | Admit: 2022-03-10 | Discharge: 2022-03-10 | Disposition: A | Discharge status: Home / Self Care | Payer: 59 | Source: Ambulatory Visit | Attending: Gastroenterology | Admitting: Gastroenterology

## 2022-03-10 DIAGNOSIS — R748 Abnormal levels of other serum enzymes: Secondary | ICD-10-CM

## 2022-04-22 ENCOUNTER — Other Ambulatory Visit: Payer: Self-pay | Admitting: Obstetrics and Gynecology

## 2022-04-22 DIAGNOSIS — Z1231 Encounter for screening mammogram for malignant neoplasm of breast: Secondary | ICD-10-CM

## 2022-07-13 ENCOUNTER — Other Ambulatory Visit: Payer: Self-pay | Admitting: Gastroenterology

## 2022-07-13 DIAGNOSIS — R748 Abnormal levels of other serum enzymes: Secondary | ICD-10-CM

## 2022-07-13 DIAGNOSIS — K51011 Ulcerative (chronic) pancolitis with rectal bleeding: Secondary | ICD-10-CM

## 2022-07-28 ENCOUNTER — Other Ambulatory Visit: Payer: 59

## 2022-08-17 ENCOUNTER — Other Ambulatory Visit: Payer: 59

## 2022-09-20 ENCOUNTER — Ambulatory Visit
Admission: RE | Admit: 2022-09-20 | Discharge: 2022-09-20 | Disposition: A | Discharge status: Home / Self Care | Payer: Commercial Managed Care - HMO | Source: Ambulatory Visit | Attending: Gastroenterology | Admitting: Gastroenterology

## 2022-09-20 DIAGNOSIS — K51011 Ulcerative (chronic) pancolitis with rectal bleeding: Secondary | ICD-10-CM

## 2022-09-20 DIAGNOSIS — R748 Abnormal levels of other serum enzymes: Secondary | ICD-10-CM

## 2022-09-20 MED ORDER — GADOPICLENOL 0.5 MMOL/ML IV SOLN
10.0000 mL | Freq: Once | INTRAVENOUS | Status: AC | PRN
Start: 2022-09-20 — End: 2022-09-20
  Administered 2022-09-20: 10 mL via INTRAVENOUS

## 2022-09-22 ENCOUNTER — Other Ambulatory Visit (HOSPITAL_COMMUNITY): Payer: Self-pay | Admitting: Gastroenterology

## 2022-09-22 DIAGNOSIS — R748 Abnormal levels of other serum enzymes: Secondary | ICD-10-CM

## 2022-11-11 ENCOUNTER — Ambulatory Visit: Payer: Medicare HMO | Admitting: Surgery

## 2022-11-11 ENCOUNTER — Encounter: Payer: Self-pay | Admitting: Surgery

## 2022-11-11 VITALS — BP 102/71 | HR 84 | Temp 98.0°F | Ht 66.0 in | Wt 299.0 lb

## 2022-11-11 DIAGNOSIS — L989 Disorder of the skin and subcutaneous tissue, unspecified: Secondary | ICD-10-CM

## 2022-11-11 DIAGNOSIS — M7989 Other specified soft tissue disorders: Secondary | ICD-10-CM

## 2022-11-11 NOTE — Progress Notes (Signed)
Patient ID: Tracy Pacheco, female   DOB: 06-12-1955, 67 y.o.   MRN: [id number]  Chief Complaint: Cyst/lipoma present for 3 to 4 weeks.  History of Present Illness Tracy Pacheco is a 67 y.o. female with the above, she denies any tenderness or pain.  No known drainage.  Past Medical History Past Medical History:  Diagnosis Date   Diabetes mellitus without complication (HCC)       Past Surgical History:  Procedure Laterality Date   BREAST BIOPSY Right    early 2000, benign   TONSILLECTOMY      No Known Allergies  Current Outpatient Medications  Medication Sig Dispense Refill   atorvastatin (LIPITOR) 10 MG tablet Take 10 mg by mouth daily.     balsalazide (COLAZAL) 750 MG capsule Take 750 mg by mouth 3 (three) times daily.     cetirizine (ZYRTEC) 5 MG tablet Take 5 mg by mouth daily.     hydrochlorothiazide (HYDRODIURIL) 25 MG tablet Take 25 mg by mouth daily.     JARDIANCE 25 MG TABS tablet Take 25 mg by mouth daily.     lisinopril (ZESTRIL) 10 MG tablet Take 20 mg by mouth daily.     meloxicam (MOBIC) 15 MG tablet Take 15 mg by mouth daily.     Multiple Vitamin (MULTIVITAMIN) tablet Take 1 tablet by mouth daily.     OneTouch Delica Lancets 30G MISC      ONETOUCH VERIO test strip 1 each daily.     OZEMPIC, 0.25 OR 0.5 MG/DOSE, 2 MG/3ML SOPN Inject 0.5 mg into the skin once a week.     No current facility-administered medications for this visit.    Family History Family History  Problem Relation Age of Onset   Cancer Father    Breast cancer Neg Hx       Social History Social History   Tobacco Use   Smoking status: Never    Passive exposure: Never   Smokeless tobacco: Never  Vaping Use   Vaping Use: Never used  Substance Use Topics   Alcohol use: Never   Drug use: Never        Review of Systems  All other systems reviewed and are negative.    Physical Exam Blood pressure 102/71, pulse 84, temperature 98 F (36.7 C), height 5\' 6"  (1.676 m), weight  299 lb (135.6 kg), SpO2 97 %. Last Weight  Most recent update: 11/11/2022  2:33 PM    Weight  135.6 kg (299 lb)             CONSTITUTIONAL: Well developed, and nourished, appropriately responsive and aware without distress.   EYES: Sclera non-icteric.   EARS, NOSE, MOUTH AND THROAT:  The oropharynx is clear. Oral mucosa is pink and moist.     Hearing is intact to voice.  NECK: Trachea is midline, and there is no jugular venous distension.  LYMPH NODES:  Lymph nodes in the neck are not appreciated. RESPIRATORY:   Normal respiratory effort without pathologic use of accessory muscles. CARDIOVASCULAR:  Well perfused.  GI: The abdomen is  soft, nontender, and nondistended. There were no palpable masses.  MUSCULOSKELETAL:  Symmetrical muscle tone appreciated in all four extremities.    SKIN: Skin turgor is normal. No pathologic skin lesions appreciated.   There is a 1 cm subcutaneous density that is discrete on the right side of the posterior neck.  It does not appear to be dermally involved.  Evidence of punctum or involvement of the actual skin.  NEUROLOGIC:  Motor and sensation appear grossly normal.  Cranial nerves are grossly without defect. PSYCH:  Alert and oriented to person, place and time. Affect is appropriate for situation.  Data Reviewed I have personally reviewed what is currently available of the patient's imaging, recent labs and medical records.   Labs:      No data to display             No data to display            Imaging: Radiological images reviewed:   Within last 24 hrs: No results found.  Assessment    Subcutaneous lesion right posterior neck, slightly greater than 1 cm. Patient Active Problem List   Diagnosis Date Noted   Diabetes mellitus type 2, uncomplicated (HCC) 06/10/2020   Sebaceous cyst 05/20/2020   Morbid obesity with BMI of 50.0-59.9, adult ([facility]) 09/26/2019   Primary osteoarthritis of right knee 09/26/2019    Plan    She would  like to pursue excision, we will schedule her for an outpatient procedure later in the office perhaps next week.  Face-to-face time spent with the patient and accompanying care providers(if present) was 15 minutes, with more than 50% of the time spent counseling, educating, and coordinating care of the patient.    These notes generated with voice recognition software. I apologize for typographical errors.  Campbell Lerner M.D., FACS 11/11/2022, 2:46 PM

## 2022-11-15 DIAGNOSIS — M7989 Other specified soft tissue disorders: Secondary | ICD-10-CM | POA: Insufficient documentation

## 2022-11-18 IMAGING — CR DG CHEST 2V
2 series · 2 of 2 positions shown · non-contrast
Comparison: None Available.

CLINICAL DATA: Productive cough

EXAM:
CHEST - 2 VIEW

[chest pa]
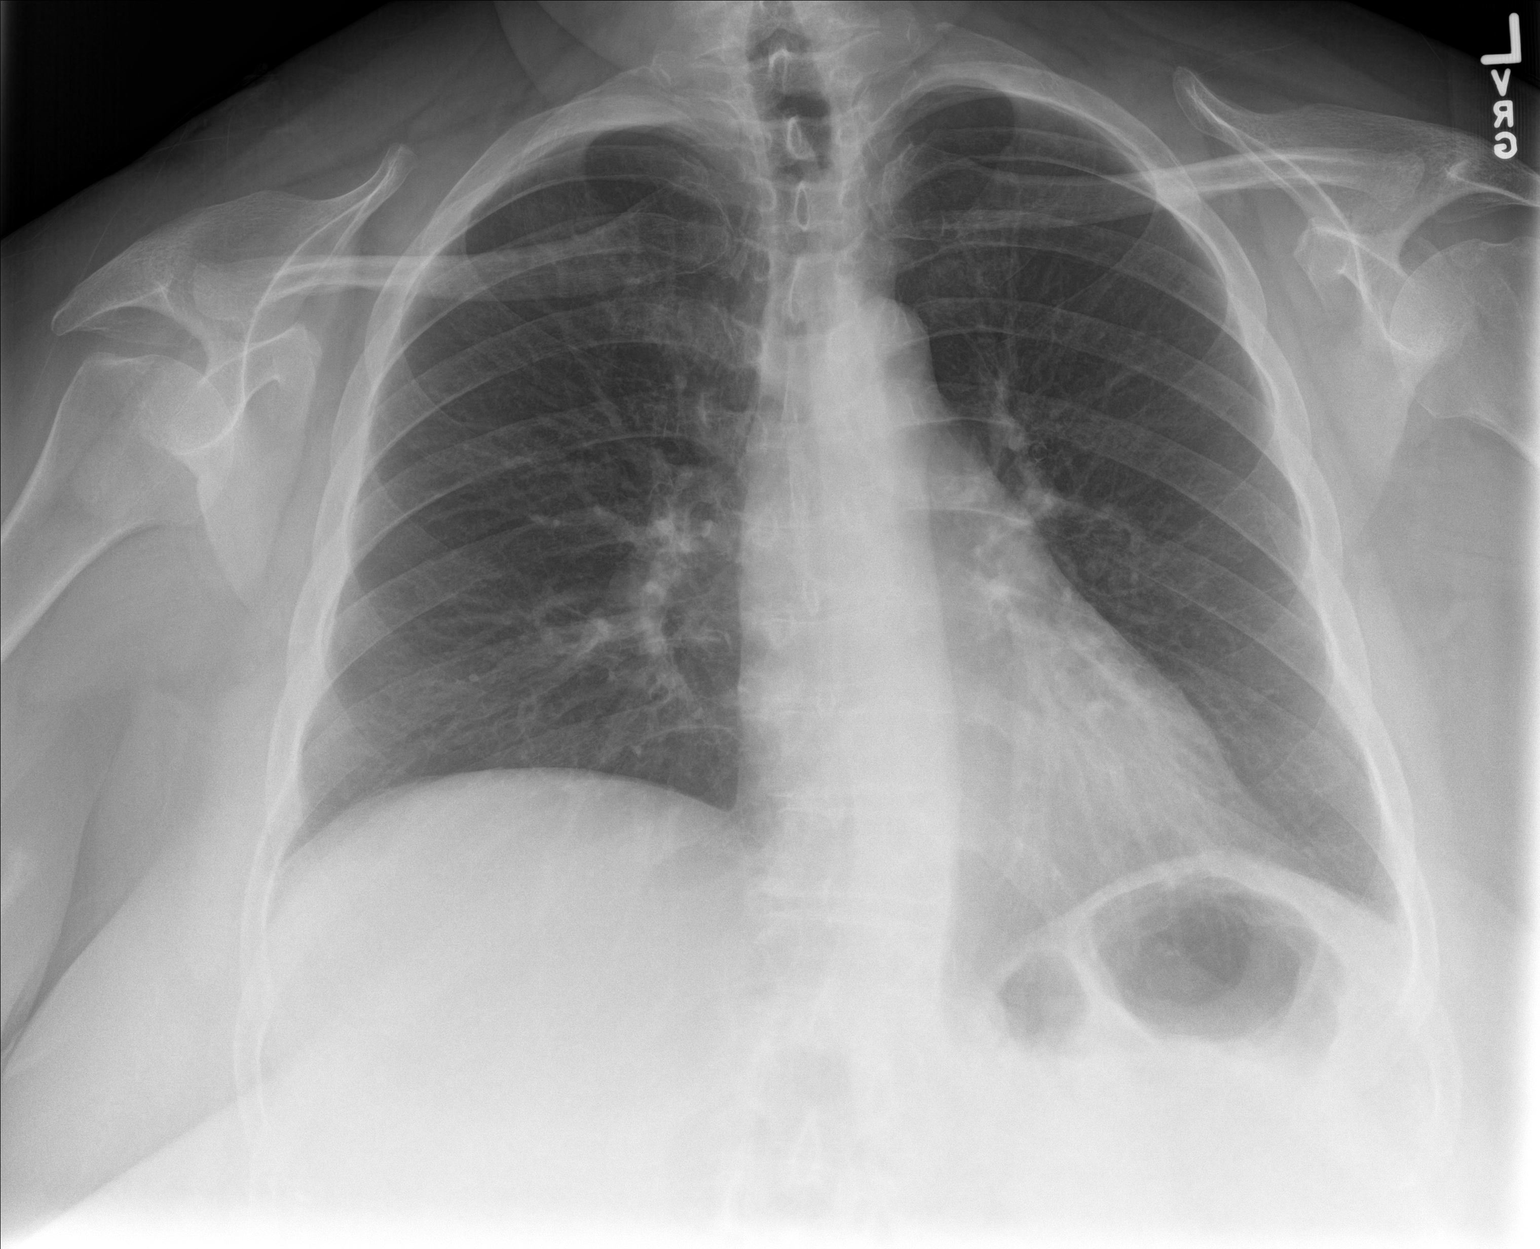

[chest lat]
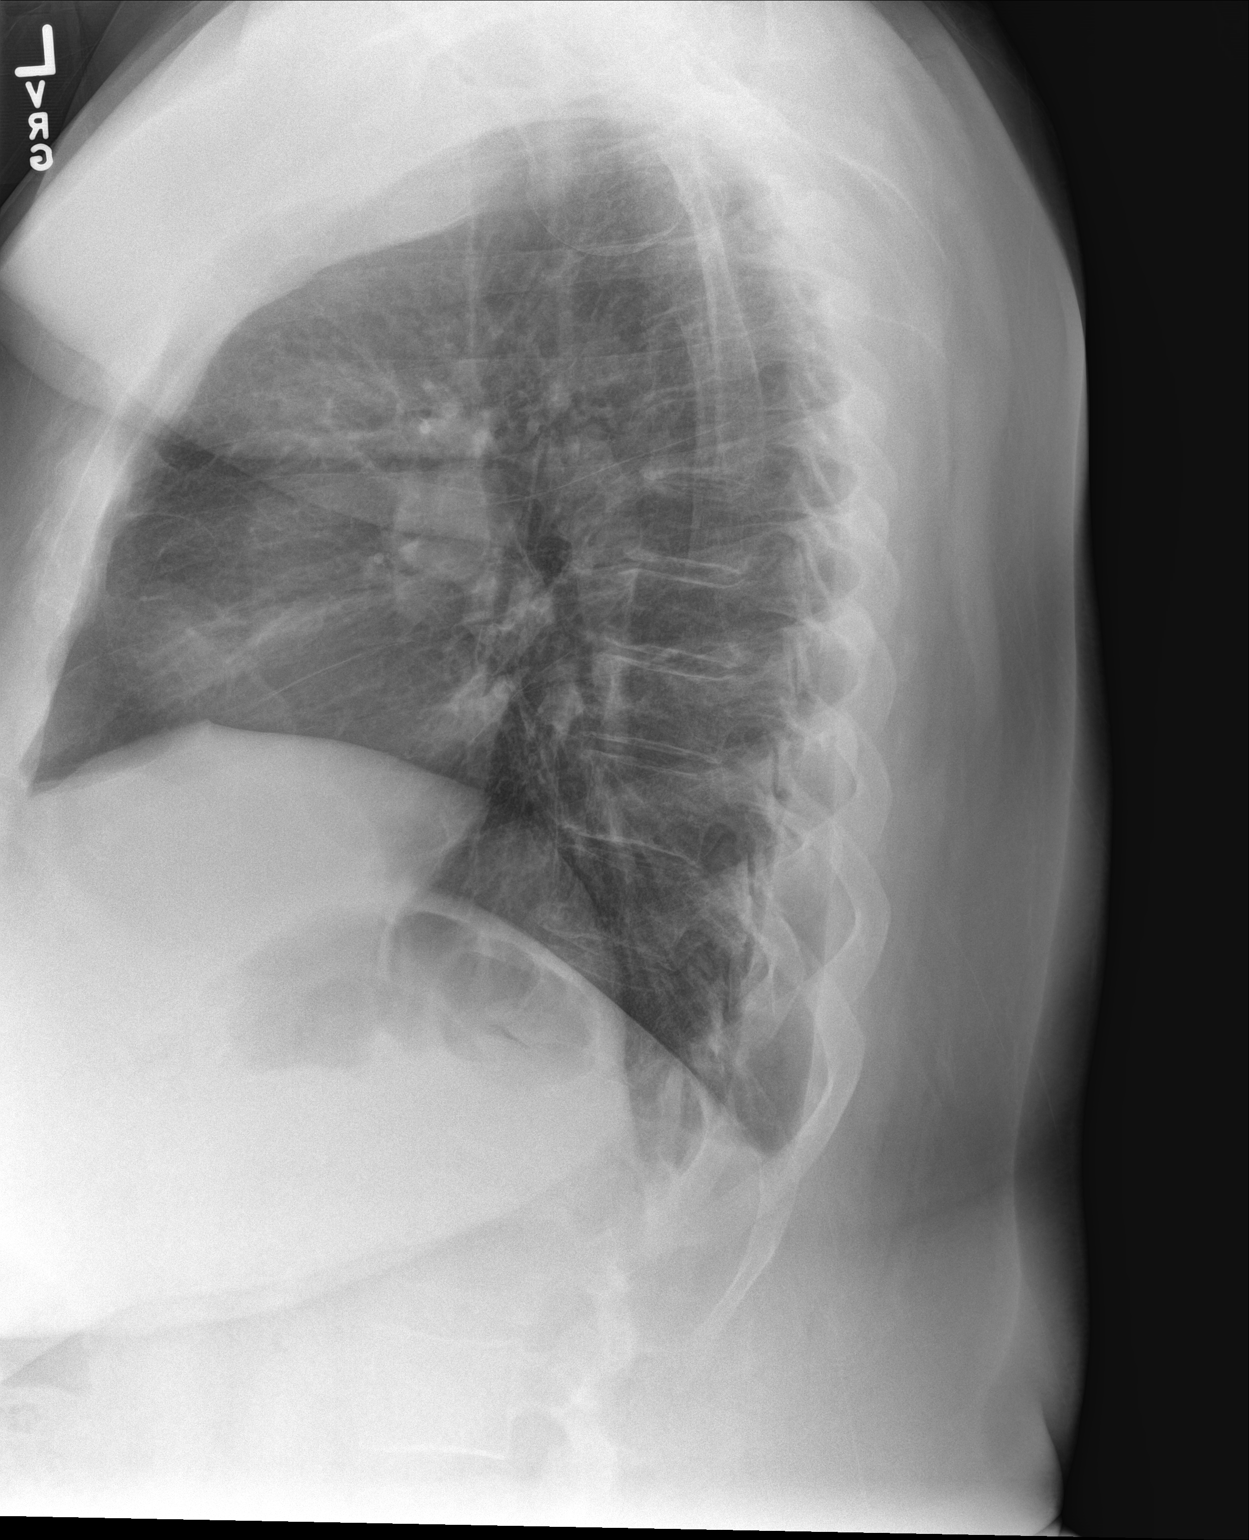

[2 of 2 positions shown; findings below may reference images not displayed]

FINDINGS: The heart size and mediastinal contours are within normal limits.
Both lungs are clear. The visualized skeletal structures are
unremarkable.
IMPRESSION: No active cardiopulmonary disease.

## 2022-12-13 ENCOUNTER — Ambulatory Visit
Admission: RE | Admit: 2022-12-13 | Discharge: 2022-12-13 | Disposition: A | Discharge status: Home / Self Care | Payer: Medicare HMO | Source: Ambulatory Visit | Attending: Obstetrics and Gynecology | Admitting: Obstetrics and Gynecology

## 2022-12-13 DIAGNOSIS — Z1231 Encounter for screening mammogram for malignant neoplasm of breast: Secondary | ICD-10-CM | POA: Diagnosis present

## 2023-01-10 NOTE — Progress Notes (Unsigned)
Patient ID: Tracy Pacheco, female   DOB: [birth year], 67 y.o.   MRN: [id number]  Chief Complaint: [place]EE[place]: Cyst/lipoma posterior right neck.  History of Present Illness Tracy Pacheco is a 67 y.o. female with the above, she denies any tenderness or pain.  No known drainage.  Past Medical History Past Medical History:  Diagnosis Date   Diabetes mellitus without complication (HCC)       Past Surgical History:  Procedure Laterality Date   BREAST BIOPSY Right    early 2000, benign   TONSILLECTOMY      No Known Allergies  Current Outpatient Medications  Medication Sig Dispense Refill   atorvastatin (LIPITOR) 10 MG tablet Take 10 mg by mouth daily.     balsalazide (COLAZAL) 750 MG capsule Take 750 mg by mouth 3 (three) times daily.     cetirizine (ZYRTEC) 5 MG tablet Take 5 mg by mouth daily.     hydrochlorothiazide (HYDRODIURIL) 25 MG tablet Take 25 mg by mouth daily.     JARDIANCE 25 MG TABS tablet Take 25 mg by mouth daily.     lisinopril (ZESTRIL) 10 MG tablet Take 20 mg by mouth daily.     meloxicam (MOBIC) 15 MG tablet Take 15 mg by mouth daily.     Multiple Vitamin (MULTIVITAMIN) tablet Take 1 tablet by mouth daily.     OneTouch Delica Lancets 30G MISC      ONETOUCH VERIO test strip 1 each daily.     OZEMPIC, 0.25 OR 0.5 MG/DOSE, 2 MG/3ML SOPN Inject 0.5 mg into the skin once a week.     No current facility-administered medications for this visit.    Family History Family History  Problem Relation Age of Onset   Cancer Father    Breast cancer Neg Hx       Social History Social History   Tobacco Use   Smoking status: Never    Passive exposure: Never   Smokeless tobacco: Never  Vaping Use   Vaping status: Never Used  Substance Use Topics   Alcohol use: Never   Drug use: Never        Review of Systems  All other systems reviewed and are negative.    Physical Exam There were no vitals taken for this visit.    CONSTITUTIONAL: Well developed, and  nourished, appropriately responsive and aware without distress.   EYES: Sclera non-icteric.   EARS, NOSE, MOUTH AND THROAT:  The oropharynx is clear. Oral mucosa is pink and moist.     Hearing is intact to voice.  NECK: Trachea is midline, and there is no jugular venous distension.  LYMPH NODES:  Lymph nodes in the neck are not appreciated. RESPIRATORY:   Normal respiratory effort without pathologic use of accessory muscles. CARDIOVASCULAR:  Well perfused.  GI: The abdomen is  soft, nontender, and nondistended. There were no palpable masses.  MUSCULOSKELETAL:  Symmetrical muscle tone appreciated in all four extremities.    SKIN: Skin turgor is normal. No pathologic skin lesions appreciated.   There is a 1 cm subcutaneous density that is discrete on the right side of the posterior neck.  It does not appear to be dermally involved.  Evidence of punctum or involvement of the actual skin.  NEUROLOGIC:  Motor and sensation appear grossly normal.  Cranial nerves are grossly without defect. PSYCH:  Alert and oriented to person, place and time. Affect is appropriate for situation.  Data Reviewed I have personally reviewed what is currently available of the patient's  imaging, recent labs and medical records.   Labs:      No data to display             No data to display            Imaging: Radiological images reviewed:   Within last 24 hrs: No results found.  Assessment    Subcutaneous lesion right posterior neck, slightly greater than 1 cm. Patient Active Problem List   Diagnosis Date Noted   Mass of soft tissue of neck 11/15/2022   Diabetes mellitus type 2, uncomplicated (HCC) 06/10/2020   Sebaceous cyst 05/20/2020   Morbid obesity with BMI of 48.3, adult ([facility]) 09/26/2019   Primary osteoarthritis of right knee 09/26/2019    Plan    She would like to pursue excision, we will schedule her for an outpatient procedure later in the office perhaps next week.  Face-to-face  time spent with the patient and accompanying care providers(if present) was 15 minutes, with more than 50% of the time spent counseling, educating, and coordinating care of the patient.    These notes generated with voice recognition software. I apologize for typographical errors.  Campbell Lerner M.D., FACS 01/10/2023, 11:31 AM

## 2023-01-11 ENCOUNTER — Ambulatory Visit: Payer: Medicare HMO | Admitting: Surgery

## 2023-01-11 ENCOUNTER — Encounter: Payer: Self-pay | Admitting: Surgery

## 2023-01-11 VITALS — BP 94/65 | HR 85 | Temp 98.0°F | Ht 66.0 in | Wt 301.0 lb

## 2023-01-11 DIAGNOSIS — M7989 Other specified soft tissue disorders: Secondary | ICD-10-CM

## 2023-01-11 DIAGNOSIS — L989 Disorder of the skin and subcutaneous tissue, unspecified: Secondary | ICD-10-CM | POA: Diagnosis not present

## 2023-01-11 NOTE — Patient Instructions (Signed)
Follow-up with our office as needed.  Please call and ask to speak with a nurse if you develop questions or concerns.   Call us if you need to have this area looked at or you would like to have this removed.

## 2023-01-18 ENCOUNTER — Encounter (HOSPITAL_COMMUNITY): Payer: Self-pay | Admitting: Gastroenterology

## 2023-01-18 DIAGNOSIS — R748 Abnormal levels of other serum enzymes: Secondary | ICD-10-CM

## 2023-01-20 ENCOUNTER — Other Ambulatory Visit (HOSPITAL_COMMUNITY): Payer: Self-pay | Admitting: Gastroenterology

## 2023-01-20 DIAGNOSIS — R748 Abnormal levels of other serum enzymes: Secondary | ICD-10-CM

## 2023-03-21 ENCOUNTER — Ambulatory Visit
Admission: RE | Admit: 2023-03-21 | Discharge: 2023-03-21 | Disposition: A | Discharge status: Home / Self Care | Payer: Medicare HMO | Source: Ambulatory Visit | Attending: Family Medicine | Admitting: Family Medicine

## 2023-03-21 VITALS — BP 128/86 | HR 90 | Temp 99.3°F | Resp 16 | Ht 67.0 in | Wt 310.0 lb

## 2023-03-21 DIAGNOSIS — B379 Candidiasis, unspecified: Secondary | ICD-10-CM

## 2023-03-21 DIAGNOSIS — N3000 Acute cystitis without hematuria: Secondary | ICD-10-CM | POA: Diagnosis not present

## 2023-03-21 LAB — URINALYSIS, W/ REFLEX TO CULTURE (INFECTION SUSPECTED)
Bilirubin Urine: NEGATIVE
Glucose, UA: >=500 mg/dL — AB
Ketones, ur: NEGATIVE mg/dL
Nitrite: NEGATIVE
Protein, ur: NEGATIVE mg/dL
Specific Gravity, Urine: 1.025 (ref 1.005–1.030)
pH: 5.5 (ref 5.0–8.0)

## 2023-03-21 LAB — WET PREP, GENITAL
Clue Cells Wet Prep HPF POC: NONE SEEN
Sperm: NONE SEEN
Trich, Wet Prep: NONE SEEN
WBC, Wet Prep HPF POC: <10 (ref <10)
Yeast Wet Prep HPF POC: NONE SEEN

## 2023-03-21 MED ORDER — NITROFURANTOIN MONOHYD MACRO 100 MG PO CAPS: 100.0000 mg | ORAL_CAPSULE | Freq: Two times a day (BID) | ORAL | 0 refills | Status: DC

## 2023-03-21 MED ORDER — FLUCONAZOLE 150 MG PO TABS: 150.0000 mg | ORAL_TABLET | ORAL | 0 refills | Status: AC

## 2023-03-21 NOTE — ED Triage Notes (Signed)
Pt c/o vaginal itching & pain w/urination x5 days. Denies any hematuria. Denies any concern for STD.

## 2023-03-21 NOTE — ED Provider Notes (Signed)
[facility]    CSN: [id number] Arrival date & time: 03/21/23  1538      History   Chief Complaint Chief Complaint  Patient presents with   Vaginal Itching    Appt   Dysuria     HPI HPI Tracy Pacheco is a 67 y.o. female.    Tracy Pacheco presents for vaginal irritation, vaginal itching and dysuria for the past 5 days.   Denies known STI exposure. No LMP recorded. Patient is postmenopausal.    - Abnormal vaginal discharge: unknown - vaginal odor: no - vaginal bleeding: no - Dysuria: yes - Hematuria: no - Urinary urgency:no  - Urinary frequency: no  - Fever: no - Abdominal pain: no  - Pelvic pain: no - Rash/Skin lesions/mouth ulcers: no - Nausea: no  - Vomiting: no  - Back Pain: no        Past Medical History:  Diagnosis Date   Diabetes mellitus without complication (HCC)     Patient Active Problem List   Diagnosis Date Noted   Mass of soft tissue of neck 11/15/2022   Diabetes mellitus type 2, uncomplicated (HCC) 06/10/2020   Sebaceous cyst 05/20/2020   Morbid obesity with BMI of 48.3, adult (HCC) 09/26/2019   Primary osteoarthritis of right knee 09/26/2019    Past Surgical History:  Procedure Laterality Date   BREAST BIOPSY Right    early 2000, benign   TONSILLECTOMY      OB History   No obstetric history on file.      Home Medications    Prior to Admission medications   Medication Sig Start Date End Date Taking? Authorizing Provider  atorvastatin (LIPITOR) 10 MG tablet Take 10 mg by mouth daily. 05/06/20  Yes [provider]  balsalazide (COLAZAL) 750 MG capsule Take 750 mg by mouth 3 (three) times daily. 11/09/22  Yes [provider]  cetirizine (ZYRTEC) 5 MG tablet Take 5 mg by mouth daily.   Yes [provider]  fluconazole (DIFLUCAN) 150 MG tablet Take 1 tablet (150 mg total) by mouth every 3 (three) days for 2 doses. 03/21/23 03/25/23 Yes Kista Robb, DO  hydrochlorothiazide  (HYDRODIURIL) 25 MG tablet Take 25 mg by mouth daily. 04/14/20  Yes [provider]  JARDIANCE 25 MG TABS tablet Take 25 mg by mouth daily. 10/14/22  Yes [provider]  lisinopril (ZESTRIL) 10 MG tablet Take 20 mg by mouth daily.   Yes [provider]  meloxicam (MOBIC) 15 MG tablet Take 15 mg by mouth daily. 05/16/20  Yes [provider]  mesalamine (LIALDA) 1.2 g EC tablet 2 tablets with a meal Orally Once a day for 30 days   Yes [provider]  metoprolol succinate (TOPROL-XL) 25 MG 24 hr tablet Take 25 mg by mouth daily. 11/02/22  Yes [provider]  Multiple Vitamin (MULTIVITAMIN) tablet Take 1 tablet by mouth daily.   Yes [provider]  Multiple Vitamins-Minerals (CENTRUM ADULTS) TABS 1 tablet Orally Once a day   Yes [provider]  nitrofurantoin, macrocrystal-monohydrate, (MACROBID) 100 MG capsule Take 1 capsule (100 mg total) by mouth 2 (two) times daily. 03/21/23  Yes Milen Lengacher, Seward Meth, DO  OneTouch Delica Lancets 30G MISC  04/17/20  Yes [provider]  [facility] VERIO test strip 1 each daily. 03/31/20  Yes [provider]  OZEMPIC, 0.25 OR 0.5 MG/DOSE, 2 MG/3ML SOPN Inject 0.5 mg into the skin once a week.   Yes [provider]  Family History Family History  Problem Relation Age of Onset   Cancer Father    Breast cancer Neg Hx     Social History Social History   Tobacco Use   Smoking status: Never    Passive exposure: Never   Smokeless tobacco: Never  Vaping Use   Vaping status: Never Used  Substance Use Topics   Alcohol use: Never   Drug use: Never     Allergies   Patient has no known allergies.   Review of Systems Review of Systems: :negative unless otherwise stated in HPI.      Physical Exam Triage Vital Signs ED Triage Vitals  Encounter Vitals Group     BP 03/21/23 1546 128/86     Systolic BP Percentile --      Diastolic BP Percentile --      Pulse  Rate 03/21/23 1546 90     Resp 03/21/23 1546 16     Temp 03/21/23 1546 99.3 F (37.4 C)     Temp Source 03/21/23 1546 Oral     SpO2 03/21/23 1546 94 %     Weight 03/21/23 1545 (!) 310 lb (140.6 kg)     Height 03/21/23 1545 5\' 7"  (1.702 m)     Head Circumference --      Peak Flow --      Pain Score 03/21/23 1551 0     Pain Loc --      Pain Education --      Exclude from Growth Chart --    No data found.  Updated Vital Signs BP 128/86 (BP Location: Left Arm)   Pulse 90   Temp 99.3 F (37.4 C) (Oral)   Resp 16   Ht 5\' 7"  (1.702 m)   Wt (!) 140.6 kg   SpO2 94%   BMI 48.55 kg/m   Visual Acuity Right Eye Distance:   Left Eye Distance:   Bilateral Distance:    Right Eye Near:   Left Eye Near:    Bilateral Near:     Physical Exam GEN: well appearing female in no acute distress  CVS: well perfused  RESP: speaking in full sentences without pause  GU: deferred, patient performed self swab     UC Treatments / Results  Labs (all labs ordered are listed, but only abnormal results are displayed) Labs Reviewed  URINALYSIS, W/ REFLEX TO CULTURE (INFECTION SUSPECTED) - Abnormal; Notable for the following components:      Result Value   APPearance CLOUDY (*)    Glucose, UA >=500 (*)    Hgb urine dipstick TRACE (*)    Leukocytes,Ua SMALL (*)    Bacteria, UA MANY (*)    All other components within normal limits  WET PREP, GENITAL  URINE CULTURE    EKG   Radiology No results found.  Procedures Procedures (including critical care time)  Medications Ordered in UC Medications - No data to display  Initial Impression / Assessment and Plan / UC Course  I have reviewed the triage vital signs and the nursing notes.  Pertinent labs & imaging results that were available during my care of the patient were reviewed by me and considered in my medical decision making (see chart for details).      Patient is a 67 y.o.Marland Kitchen female  who presents for vaginal irritation and  dysuria. Overall patient is well-appearing and afebrile.  Vital signs stable.  UA consistent with acute cystitis.   Hematuria not supported on microscopy.  Treat with  Macrobid 2 times daily for 5 days. Urine culture obtained.   Follow-up sensitivities and change antibiotics, if needed.  There is some yeast on urinalysis but surprisingly has no  evidence of yeast vaginitis nor bacterial vaginitis or trichomonas.   - Treatment: Macrobid twice daily for 5 days.Diflucan for 2 doses for  yeast infection    Return precautions including abdominal pain, fever, chills, nausea, or vomiting given. Discussed MDM, treatment plan and plan for follow-up with patient who agrees with plan.     Final Clinical Impressions(s) / UC Diagnoses   Final diagnoses:  Acute cystitis without hematuria  Yeast infection     Discharge Instructions      Stop by the pharmacy to pick up your prescriptions.  Follow up with your primary care provider as needed.     ED Prescriptions     Medication Sig Dispense Auth. Provider   fluconazole (DIFLUCAN) 150 MG tablet Take 1 tablet (150 mg total) by mouth every 3 (three) days for 2 doses. 2 tablet Hubbard Seldon, DO   nitrofurantoin, macrocrystal-monohydrate, (MACROBID) 100 MG capsule Take 1 capsule (100 mg total) by mouth 2 (two) times daily. 10 capsule Katha Cabal, DO      PDMP not reviewed this encounter.   Katha Cabal, DO 03/21/23 1654

## 2023-03-21 NOTE — Discharge Instructions (Addendum)
Stop by the pharmacy to pick up your prescriptions.  Follow up with your primary care provider as needed.  

## 2023-03-22 LAB — URINE CULTURE: Culture: 20000 — AB

## 2023-05-10 ENCOUNTER — Other Ambulatory Visit: Payer: Self-pay | Admitting: Obstetrics and Gynecology

## 2023-05-10 DIAGNOSIS — Z1231 Encounter for screening mammogram for malignant neoplasm of breast: Secondary | ICD-10-CM

## 2023-07-29 ENCOUNTER — Ambulatory Visit
Admission: RE | Admit: 2023-07-29 | Discharge: 2023-07-29 | Disposition: A | Discharge status: Home / Self Care | Payer: Medicare HMO | Source: Ambulatory Visit | Attending: Family Medicine | Admitting: Family Medicine

## 2023-07-29 ENCOUNTER — Ambulatory Visit: Payer: Medicare HMO

## 2023-07-29 VITALS — BP 133/77 | HR 109 | Temp 101.5°F | Resp 20 | Ht 67.0 in | Wt 310.0 lb

## 2023-07-29 DIAGNOSIS — R509 Fever, unspecified: Secondary | ICD-10-CM

## 2023-07-29 DIAGNOSIS — J989 Respiratory disorder, unspecified: Secondary | ICD-10-CM

## 2023-07-29 DIAGNOSIS — J40 Bronchitis, not specified as acute or chronic: Secondary | ICD-10-CM | POA: Diagnosis present

## 2023-07-29 LAB — RESP PANEL BY RT-PCR (RSV, FLU A&B, COVID)  RVPGX2
Influenza A by PCR: NEGATIVE
Influenza B by PCR: NEGATIVE
Resp Syncytial Virus by PCR: NEGATIVE
SARS Coronavirus 2 by RT PCR: NEGATIVE

## 2023-07-29 LAB — GROUP A STREP BY PCR: Group A Strep by PCR: NOT DETECTED

## 2023-07-29 MED ORDER — ACETAMINOPHEN 325 MG PO TABS
975.0000 mg | ORAL_TABLET | Freq: Once | ORAL | Status: AC
  Administered 2023-07-29: 975 mg via ORAL

## 2023-07-29 MED ORDER — PREDNISONE 10 MG (21) PO TBPK: ORAL_TABLET | Freq: Every day | ORAL | 0 refills | Status: DC

## 2023-07-29 MED ORDER — HYDROCOD POLI-CHLORPHE POLI ER 10-8 MG/5ML PO SUER: 5.0000 mL | Freq: Two times a day (BID) | ORAL | 0 refills | Status: DC | PRN

## 2023-07-29 MED ORDER — AZITHROMYCIN 250 MG PO TABS: ORAL_TABLET | ORAL | 0 refills | Status: DC

## 2023-07-29 NOTE — ED Triage Notes (Signed)
 Pt c/o cough, runny nose, nasal congestion, hoarseness, and sore throat. Started about a week ago. Denies fever. Home covid test negative.

## 2023-07-29 NOTE — ED Provider Notes (Signed)
 [facility]    CSN: [id number] Arrival date & time: 07/29/23  1119      History   Chief Complaint Chief Complaint  Patient presents with   Cough   Appointment    HPI Tracy Pacheco is a 68 y.o. female.   HPI  History obtained from the patient. Tracy Pacheco presents for productive cough for over a week ago.  Symptoms got better but then she started having fever yesterday.  Taking Tylenol Cold-Sinus and Tylenol severe cough without relief. Endorses rhinorrhea and sore throat.  Denies vomiting and diarrhea. Headache is improving.   No history of asthma. Denies vaping and smoking.          Past Medical History:  Diagnosis Date   Diabetes mellitus without complication [facility])     Patient Active Problem List   Diagnosis Date Noted   Mass of soft tissue of neck 11/15/2022   Diabetes mellitus type 2, uncomplicated (HCC) 06/10/2020   Sebaceous cyst 05/20/2020   Morbid obesity with BMI of 48.3, adult (HCC) 09/26/2019   Primary osteoarthritis of right knee 09/26/2019    Past Surgical History:  Procedure Laterality Date   BREAST BIOPSY Right    early 2000, benign   TONSILLECTOMY      OB History   No obstetric history on file.      Home Medications    Prior to Admission medications   Medication Sig Start Date End Date Taking? Authorizing Provider  azithromycin (ZITHROMAX Z-PAK) 250 MG tablet Take 2 tablets on day 1 then 1 tablet daily 07/29/23  Yes Finnlee Guarnieri, DO  chlorpheniramine-HYDROcodone (TUSSIONEX) 10-8 MG/5ML Take 5 mLs by mouth every 12 (twelve) hours as needed. 07/29/23  Yes Jesicca Dipierro, DO  predniSONE (STERAPRED UNI-PAK 21 TAB) 10 MG (21) TBPK tablet Take by mouth daily. Take 6 tabs by mouth daily for 1, then 5 tabs for 1 day, then 4 tabs for 1 day, then 3 tabs for 1 day, then 2 tabs for 1 day, then 1 tab for 1 day. 07/29/23  Yes Amirr Achord, DO  atorvastatin (LIPITOR) 10 MG tablet Take 10 mg by mouth daily. 05/06/20   [provider]  balsalazide (COLAZAL) 750 MG capsule Take 750 mg by mouth 3 (three) times daily. 11/09/22   [provider]  cetirizine (ZYRTEC) 5 MG tablet Take 5 mg by mouth daily.    [provider]  hydrochlorothiazide (HYDRODIURIL) 25 MG tablet Take 25 mg by mouth daily. 04/14/20   [provider]  JARDIANCE 25 MG TABS tablet Take 25 mg by mouth daily. 10/14/22   [provider]  lisinopril (ZESTRIL) 10 MG tablet Take 20 mg by mouth daily.    [provider]  meloxicam (MOBIC) 15 MG tablet Take 15 mg by mouth daily. 05/16/20   [provider]  mesalamine (LIALDA) 1.2 g EC tablet 2 tablets with a meal Orally Once a day for 30 days    [provider]  metoprolol succinate (TOPROL-XL) 25 MG 24 hr tablet Take 25 mg by mouth daily. 11/02/22   [provider]  Multiple Vitamin (MULTIVITAMIN) tablet Take 1 tablet by mouth daily.    [provider]  Multiple Vitamins-Minerals (CENTRUM ADULTS) TABS 1 tablet Orally Once a day    [provider]  OneTouch Delica Lancets 30G MISC  04/17/20   [provider]  [facility] VERIO test strip 1 each daily. 03/31/20   [provider]  OZEMPIC, 0.25 OR 0.5 MG/DOSE, 2  MG/3ML SOPN Inject 0.5 mg into the skin once a week.    [provider]    Family History Family History  Problem Relation Age of Onset   Cancer Father    Breast cancer Neg Hx     Social History Social History   Tobacco Use   Smoking status: Never    Passive exposure: Never   Smokeless tobacco: Never  Vaping Use   Vaping status: Never Used  Substance Use Topics   Alcohol use: Never   Drug use: Never     Allergies   Patient has no known allergies.   Review of Systems Review of Systems: negative unless otherwise stated in HPI.      Physical Exam Triage Vital Signs ED Triage Vitals  Encounter Vitals Group     BP 07/29/23 1144 133/77     Systolic BP Percentile --       Diastolic BP Percentile --      Pulse Rate 07/29/23 1144 (!) 109     Resp 07/29/23 1144 20     Temp 07/29/23 1144 (!) 101.5 F (38.6 C)     Temp Source 07/29/23 1144 Oral     SpO2 07/29/23 1144 93 %     Weight 07/29/23 1137 (!) 310 lb (140.6 kg)     Height 07/29/23 1137 5\' 7"  (1.702 m)     Head Circumference --      Peak Flow --      Pain Score 07/29/23 1137 5     Pain Loc --      Pain Education --      Exclude from Growth Chart --    No data found.  Updated Vital Signs BP 133/77 (BP Location: Left Arm)   Pulse (!) 109   Temp (!) 101.5 F (38.6 C) (Oral)   Resp 20   Ht 5\' 7"  (1.702 m)   Wt (!) 140.6 kg   SpO2 93%   BMI 48.55 kg/m   Visual Acuity Right Eye Distance:   Left Eye Distance:   Bilateral Distance:    Right Eye Near:   Left Eye Near:    Bilateral Near:     Physical Exam GEN:     alert, non-toxic appearing female in no distress    HENT:  mucus membranes moist, oropharyngeal without lesions or erythema, no tonsillar hypertrophy or exudates, no nasal discharge EYES:   no scleral injection or discharge RESP:  no increased work of breathing, coarse breath sounds bilaterally, no wheezing though difficult to auscultate due to body habitus CVS:   regular rhythm, tachycardic Skin:   warm and dry    UC Treatments / Results  Labs (all labs ordered are listed, but only abnormal results are displayed) Labs Reviewed  GROUP A STREP BY PCR  RESP PANEL BY RT-PCR (RSV, FLU A&B, COVID)  [id number]    EKG   Radiology DG Chest 2 View Result Date: 07/29/2023 CLINICAL DATA:  Cough, fever. EXAM: CHEST - 2 VIEW COMPARISON:  November 25, 2021. FINDINGS: The heart size and mediastinal contours are within normal limits. Both lungs are clear. The visualized skeletal structures are unremarkable. IMPRESSION: No active cardiopulmonary disease. Electronically Signed   By: Lupita Raider M.D.   On: 07/29/2023 14:25     Procedures Procedures (including critical care  time)  Medications Ordered in UC Medications  acetaminophen (TYLENOL) tablet 975 mg (975 mg Oral Given 07/29/23 1145)    Initial Impression / Assessment and Plan / UC  Course  I have reviewed the triage vital signs and the nursing notes.  Pertinent labs & imaging results that were available during my care of the patient were reviewed by me and considered in my medical decision making (see chart for details).       Pt is a 68 y.o. female who presents for over a week of respiratory symptoms. Shantina is tachycardic and febrile here at 101.30F.  Given Tylenol 975 mg.  Satting 93% on room air. Overall pt is non-toxic appearing, well hydrated, without respiratory distress.  Pulmonary exam is remarkable for coarse breath sounds bilaterally but no wheezing.  COVID and influenza panel deferred due to duration of symptoms.  Recommended chest x-ray given that she has a fever with cough and she is agreeable.  Chest xray personally reviewed by me without focal pneumonia, pleural effusion, cardiomegaly or pneumothorax.  Patient aware the radiologist has not read her xray and is comfortable with the preliminary read by me. Will review radiologist read when available and call patient if a change in plan is warranted.  Pt agreeable to this plan prior to discharge.   Treat suspected bronchitis with antibiotics and steroids as below.  Tussionex for cough and allow patient to rest.  Radiologist impression reviewed.   Final Clinical Impressions(s) / UC Diagnoses   Final diagnoses:  Respiratory illness with fever  Bronchitis     Discharge Instructions      Your COVID, strep, influenza and RSV tests are negative.  Your chest xray shows was not concerning for pneumonia.  If the radiologist disagrees, I will call you as you will need a second antibiotic.  Stop by the pharmacy to pick up your prescriptions.  Follow up with your primary care provider or return to the urgent care, if not improving.     ED  Prescriptions     Medication Sig Dispense Auth. Provider   azithromycin (ZITHROMAX Z-PAK) 250 MG tablet Take 2 tablets on day 1 then 1 tablet daily 6 tablet Byren Pankow, DO   predniSONE (STERAPRED UNI-PAK 21 TAB) 10 MG (21) TBPK tablet Take by mouth daily. Take 6 tabs by mouth daily for 1, then 5 tabs for 1 day, then 4 tabs for 1 day, then 3 tabs for 1 day, then 2 tabs for 1 day, then 1 tab for 1 day. 21 tablet Taisa Deloria, DO   chlorpheniramine-HYDROcodone (TUSSIONEX) 10-8 MG/5ML Take 5 mLs by mouth every 12 (twelve) hours as needed. 115 mL Denesha Brouse, Seward Meth, DO      I have reviewed the PDMP during this encounter.   Katha Cabal, DO 08/01/23 [phone]

## 2023-07-29 NOTE — Discharge Instructions (Addendum)
 Your COVID, strep, influenza and RSV tests are negative.  Your chest xray shows was not concerning for pneumonia.  If the radiologist disagrees, I will call you as you will need a second antibiotic.  Stop by the pharmacy to pick up your prescriptions.  Follow up with your primary care provider or return to the urgent care, if not improving.

## 2023-12-14 ENCOUNTER — Ambulatory Visit
Admission: RE | Admit: 2023-12-14 | Discharge: 2023-12-14 | Disposition: A | Discharge status: Home / Self Care | Source: Ambulatory Visit | Attending: Obstetrics and Gynecology | Admitting: Obstetrics and Gynecology

## 2023-12-14 DIAGNOSIS — Z1231 Encounter for screening mammogram for malignant neoplasm of breast: Secondary | ICD-10-CM | POA: Diagnosis present

## 2023-12-21 ENCOUNTER — Encounter: Payer: Self-pay | Admitting: Internal Medicine

## 2024-03-07 DIAGNOSIS — M170 Bilateral primary osteoarthritis of knee: Secondary | ICD-10-CM | POA: Diagnosis not present

## 2024-03-14 DIAGNOSIS — M170 Bilateral primary osteoarthritis of knee: Secondary | ICD-10-CM | POA: Diagnosis not present

## 2024-03-21 DIAGNOSIS — M170 Bilateral primary osteoarthritis of knee: Secondary | ICD-10-CM | POA: Diagnosis not present

## 2024-03-23 ENCOUNTER — Encounter: Payer: Self-pay | Admitting: Internal Medicine

## 2024-03-23 ENCOUNTER — Ambulatory Visit: Payer: Self-pay | Admitting: Internal Medicine

## 2024-03-23 ENCOUNTER — Ambulatory Visit (INDEPENDENT_AMBULATORY_CARE_PROVIDER_SITE_OTHER): Payer: Self-pay | Admitting: Internal Medicine

## 2024-03-23 VITALS — BP 126/88 | HR 83 | Ht 66.0 in | Wt 347.4 lb

## 2024-03-23 DIAGNOSIS — E119 Type 2 diabetes mellitus without complications: Secondary | ICD-10-CM

## 2024-03-23 DIAGNOSIS — E1159 Type 2 diabetes mellitus with other circulatory complications: Secondary | ICD-10-CM | POA: Diagnosis not present

## 2024-03-23 DIAGNOSIS — Z23 Encounter for immunization: Secondary | ICD-10-CM | POA: Diagnosis not present

## 2024-03-23 DIAGNOSIS — Z6841 Body Mass Index (BMI) 40.0 and over, adult: Secondary | ICD-10-CM | POA: Diagnosis not present

## 2024-03-23 DIAGNOSIS — I152 Hypertension secondary to endocrine disorders: Secondary | ICD-10-CM | POA: Diagnosis not present

## 2024-03-23 DIAGNOSIS — K5120 Ulcerative (chronic) proctitis without complications: Secondary | ICD-10-CM | POA: Diagnosis not present

## 2024-03-23 DIAGNOSIS — E039 Hypothyroidism, unspecified: Secondary | ICD-10-CM | POA: Diagnosis not present

## 2024-03-23 DIAGNOSIS — E1169 Type 2 diabetes mellitus with other specified complication: Secondary | ICD-10-CM | POA: Insufficient documentation

## 2024-03-23 DIAGNOSIS — K51011 Ulcerative (chronic) pancolitis with rectal bleeding: Secondary | ICD-10-CM | POA: Insufficient documentation

## 2024-03-23 DIAGNOSIS — Z13820 Encounter for screening for osteoporosis: Secondary | ICD-10-CM | POA: Insufficient documentation

## 2024-03-23 DIAGNOSIS — E782 Mixed hyperlipidemia: Secondary | ICD-10-CM

## 2024-03-23 LAB — POCT CBG (FASTING - GLUCOSE)-MANUAL ENTRY: Glucose Fasting, POC: 132 mg/dL — AB (ref 70–99)

## 2024-03-23 MED ORDER — OZEMPIC (1 MG/DOSE) 2 MG/1.5ML ~~LOC~~ SOPN: 1.0000 mg | PEN_INJECTOR | SUBCUTANEOUS | 1 refills | Status: DC

## 2024-03-23 MED ORDER — HYDROCHLOROTHIAZIDE 12.5 MG PO CAPS: 12.5000 mg | ORAL_CAPSULE | Freq: Every day | ORAL | 3 refills | Status: DC

## 2024-03-23 NOTE — Progress Notes (Signed)
 New Patient Office Visit  Subjective   Patient ID: Tracy Pacheco, female    DOB: 11-04-1955  Age: 68 y.o. MRN: [id number]  CC:  Chief Complaint  Patient presents with   Establish Care    NPE    HPI [name] presents to establish care Previous Primary Care provider/office: Dr. Jadali  she does have additional concerns to discuss today.   Patient is here today to establish care with office as her PCP. She reports she is doing well but has chronic bilateral knee pain. She receives joint gel injections from orthopedics but states she will need knee replacement surgeries in the future.   She has PMH that includes HTN, HLD, DM, Morbid obesity, Osteoarthritis, hypothyroidism, ulcerative colitis. She was previously on hydrochlorothiazide for hypertension but reports Dr. Jadali stopped it for hypotension. Patient reports BLE edema L>R. Will add back hydrochlorothiazide 12.5 mg daily to reduce edema. Patient is due for routine fasting blood work today.  She reports that Mammogram 12/2023. She states her last pap smear was within the  last few years and does not believe it is due at this time. She still has her uterus and ovaries. She reports menopause began around 68 years old. She reports she has not had a DEXA scan in the lat 2 years. Will submit an order to have that completed. Her first colonoscopy was in 2023 and she was dx at that time with UC.  Patients most recent HbgA1c was 7.1% she reports approximately 30 lb weight gain since 07/2023 even while using Ozmepic 0.5 mg weekly injections. Will increase dose to Ozmepic 1 mg weekly injections. She states her fasting blood sugars are usually low 100s. She endorses frequent hypoglycemia spells around dinner time of 60-70. She takes her glipizde in the morning but reports she does not usually eat much during the day until supper time. Encouraged patient to move glipizide dosing to bedtime and continue monitoring her sugars. Call office if  hypoglycemia continues and we will stop glipizide. Discussed trying Mounjaro in the future if she continues to have weight gain on Ozempic even with well controlled blood sugars.  Patients PHQ-9 score today was 1; GAD-7 score today was 0.  Patient would like a flu shot today.     Outpatient Encounter Medications as of 03/23/2024  Medication Sig   atorvastatin (LIPITOR) 10 MG tablet Take 10 mg by mouth daily.   balsalazide (COLAZAL) 750 MG capsule Take 750 mg by mouth 3 (three) times daily.   cetirizine (ZYRTEC) 5 MG tablet Take 5 mg by mouth daily.   glipiZIDE (GLUCOTROL XL) 5 MG 24 hr tablet Take 5 mg by mouth daily. (Patient taking differently: Take 0.5 tablets by mouth daily.)   hydrochlorothiazide (MICROZIDE) 12.5 MG capsule Take 1 capsule (12.5 mg total) by mouth daily.   JARDIANCE 25 MG TABS tablet Take 25 mg by mouth daily.   lisinopril (ZESTRIL) 10 MG tablet Take 20 mg by mouth daily.   meloxicam (MOBIC) 15 MG tablet Take 15 mg by mouth daily.   Multiple Vitamin (MULTIVITAMIN) tablet Take 1 tablet by mouth daily.   OneTouch Delica Lancets 30G MISC    ONETOUCH VERIO test strip 1 each daily.   Semaglutide, 1 MG/DOSE, (OZEMPIC, 1 MG/DOSE,) 2 MG/1.5ML SOPN Inject 1 mg into the skin once a week.   [DISCONTINUED] OZEMPIC, 0.25 OR 0.5 MG/DOSE, 2 MG/3ML SOPN Inject 0.5 mg into the skin once a week.   [DISCONTINUED] azithromycin  (ZITHROMAX  Z-PAK) 250 MG tablet Take  2 tablets on day 1 then 1 tablet daily (Patient not taking: Reported on 03/23/2024)   [DISCONTINUED] chlorpheniramine-HYDROcodone (TUSSIONEX) 10-8 MG/5ML Take 5 mLs by mouth every 12 (twelve) hours as needed. (Patient not taking: Reported on 03/23/2024)   [DISCONTINUED] hydrochlorothiazide (HYDRODIURIL) 25 MG tablet Take 25 mg by mouth daily. (Patient not taking: Reported on 03/23/2024)   [DISCONTINUED] mesalamine (LIALDA) 1.2 g EC tablet 2 tablets with a meal Orally Once a day for 30 days (Patient not taking: Reported on  03/23/2024)   [DISCONTINUED] metoprolol succinate (TOPROL-XL) 25 MG 24 hr tablet Take 25 mg by mouth daily. (Patient not taking: Reported on 03/23/2024)   [DISCONTINUED] Multiple Vitamins-Minerals (CENTRUM ADULTS) TABS 1 tablet Orally Once a day (Patient not taking: Reported on 03/23/2024)   [DISCONTINUED] predniSONE  (STERAPRED UNI-PAK 21 TAB) 10 MG (21) TBPK tablet Take by mouth daily. Take 6 tabs by mouth daily for 1, then 5 tabs for 1 day, then 4 tabs for 1 day, then 3 tabs for 1 day, then 2 tabs for 1 day, then 1 tab for 1 day. (Patient not taking: Reported on 03/23/2024)   No facility-administered encounter medications on file as of 03/23/2024.    Past Medical History:  Diagnosis Date   Diabetes mellitus without complication (HCC)     Past Surgical History:  Procedure Laterality Date   BREAST BIOPSY Right    early 2000, benign   TONSILLECTOMY      Family History  Problem Relation Age of Onset   Colon cancer Father    Breast cancer Neg Hx     Social History   Socioeconomic History   Marital status: Single    Spouse name: Not on file   Number of children: Not on file   Years of education: Not on file   Highest education level: Not on file  Occupational History   Not on file  Tobacco Use   Smoking status: Never    Passive exposure: Never   Smokeless tobacco: Never  Vaping Use   Vaping status: Never Used  Substance and Sexual Activity   Alcohol use: Never   Drug use: Never   Sexual activity: Not on file  Other Topics Concern   Not on file  Social History Narrative   Not on file   Social Drivers of Health   Financial Resource Strain: Low Risk  (09/21/2023)   Received from [facility]   Overall Financial Resource Strain (CARDIA)    Difficulty of Paying Living Expenses: Not hard at all  Food Insecurity: No Food Insecurity (09/21/2023)   Received from [facility]   Hunger Vital Sign    Within the past 12 months, you worried  that your food would run out before you got the money to buy more.: Never true    Within the past 12 months, the food you bought just didn't last and you didn't have money to get more.: Never true  Transportation Needs: No Transportation Needs (09/21/2023)   Received from [facility] - Transportation    In the past 12 months, has lack of transportation kept you from medical appointments or from getting medications?: No    Lack of Transportation (Non-Medical): No  Physical Activity: Not on file  Stress: Not on file  Social Connections: Not on file  Intimate Partner Violence: Not on file    Review of Systems  Constitutional: Negative.  Negative for chills, fever and malaise/fatigue.  HENT: Negative.  Negative  for congestion and sore throat.   Eyes: Negative.  Negative for blurred vision and pain.  Respiratory: Negative.  Negative for cough and shortness of breath.   Cardiovascular: Negative.  Negative for chest pain, palpitations and leg swelling.  Gastrointestinal: Negative.  Negative for abdominal pain, blood in stool, constipation, diarrhea, heartburn, melena, nausea and vomiting.  Genitourinary: Negative.  Negative for dysuria, flank pain, frequency and urgency.  Musculoskeletal:  Positive for joint pain (osteoarthritis bilateral knees). Negative for myalgias.  Skin: Negative.   Neurological: Negative.  Negative for dizziness, tingling, sensory change, weakness and headaches.  Endo/Heme/Allergies:        Hypoglycemia events  Psychiatric/Behavioral: Negative.  Negative for depression and suicidal ideas. The patient is not nervous/anxious.         Objective   BP 126/88   Pulse 83   Ht 5' 6 (1.676 m)   Wt (!) 347 lb 6.4 oz (157.6 kg)   SpO2 96%   BMI 56.07 kg/m   Physical Exam Vitals and nursing note reviewed.  Constitutional:      Appearance: Normal appearance.  HENT:     Head: Normocephalic and atraumatic.     Nose: Nose normal.      Mouth/Throat:     Mouth: Mucous membranes are moist.     Pharynx: Oropharynx is clear.  Eyes:     Conjunctiva/sclera: Conjunctivae normal.     Pupils: Pupils are equal, round, and reactive to light.  Cardiovascular:     Rate and Rhythm: Normal rate and regular rhythm.     Pulses: Normal pulses.     Heart sounds: Normal heart sounds. No murmur heard. Pulmonary:     Effort: Pulmonary effort is normal.     Breath sounds: Normal breath sounds. No wheezing.  Abdominal:     General: Bowel sounds are normal.     Palpations: Abdomen is soft.     Tenderness: There is no abdominal tenderness. There is no right CVA tenderness or left CVA tenderness.  Musculoskeletal:        General: Normal range of motion.     Cervical back: Normal range of motion.     Right lower leg: No edema.     Left lower leg: No edema.  Skin:    General: Skin is warm and dry.  Neurological:     General: No focal deficit present.     Mental Status: She is alert and oriented to person, place, and time.  Psychiatric:        Mood and Affect: Mood normal.        Behavior: Behavior normal.        Assessment & Plan:  Start Ozmepic 1 mg weekly injections. DEXA scan ordered. Start hydrochlorothiazide 12.5 mg daily. Continue taking other medications as prescribed. Routine fasting blood work to be collected today. Will FU with patient on results. Flu shot given today. Problem List Items Addressed This Visit     Diabetes mellitus type 2, uncomplicated (HCC) - Primary   Relevant Medications   glipiZIDE (GLUCOTROL XL) 5 MG 24 hr tablet   Semaglutide, 1 MG/DOSE, (OZEMPIC, 1 MG/DOSE,) 2 MG/1.5ML SOPN   Other Relevant Orders   POCT CBG (Fasting - Glucose) (Completed)   Hemoglobin A1c   Morbid obesity with BMI of 48.3, adult (HCC)   Relevant Medications   glipiZIDE (GLUCOTROL XL) 5 MG 24 hr tablet   Semaglutide, 1 MG/DOSE, (OZEMPIC, 1 MG/DOSE,) 2 MG/1.5ML SOPN   Ulcerative pancolitis with rectal bleeding (HCC)  Combined  hyperlipidemia associated with type 2 diabetes mellitus (HCC)   Relevant Medications   glipiZIDE (GLUCOTROL XL) 5 MG 24 hr tablet   hydrochlorothiazide (MICROZIDE) 12.5 MG capsule   Semaglutide, 1 MG/DOSE, (OZEMPIC, 1 MG/DOSE,) 2 MG/1.5ML SOPN   Other Relevant Orders   CBC with Diff   CMP14+EGFR   Lipid Panel w/o Chol/HDL Ratio   Acquired hypothyroidism   Relevant Orders   TSH+T4F+T3Free   Screening for osteoporosis   Relevant Orders   HM DEXA SCAN (Completed)   Hypertension associated with diabetes (HCC)   Relevant Medications   glipiZIDE (GLUCOTROL XL) 5 MG 24 hr tablet   hydrochlorothiazide (MICROZIDE) 12.5 MG capsule   Semaglutide, 1 MG/DOSE, (OZEMPIC, 1 MG/DOSE,) 2 MG/1.5ML SOPN   Other Relevant Orders   CBC with Diff   CMP14+EGFR   Flu vaccine need   Relevant Orders   Influenza, MDCK, trivalent, PF(Flucelvax egg-free) (Completed)    Return in about 2 weeks (around 04/06/2024).   Total time spent: 45 minutes. This time includes review of previous notes and results and patient face to face interaction during today's visit.    FERNAND FREDY RAMAN, MD  03/23/2024   This document may have been prepared by [facility] Voice Recognition software and as such may include unintentional dictation errors.

## 2024-03-24 LAB — CBC WITH DIFFERENTIAL/PLATELET
Basophils Absolute: 0.1 x10E3/uL (ref 0.0–0.2)
Basos: 1 %
EOS (ABSOLUTE): 0.5 x10E3/uL — ABNORMAL HIGH (ref 0.0–0.4)
Eos: 4 %
Hematocrit: 43.1 % (ref 34.0–46.6)
Hemoglobin: 14.5 g/dL (ref 11.1–15.9)
Immature Grans (Abs): 0 x10E3/uL (ref 0.0–0.1)
Immature Granulocytes: 0 %
Lymphocytes Absolute: 2.3 x10E3/uL (ref 0.7–3.1)
Lymphs: 21 %
MCH: 32.5 pg (ref 26.6–33.0)
MCHC: 33.6 g/dL (ref 31.5–35.7)
MCV: 97 fL (ref 79–97)
Monocytes Absolute: 0.8 x10E3/uL (ref 0.1–0.9)
Monocytes: 7 %
Neutrophils Absolute: 7.2 x10E3/uL — ABNORMAL HIGH (ref 1.4–7.0)
Neutrophils: 67 %
Platelets: 335 x10E3/uL (ref 150–450)
RBC: 4.46 x10E6/uL (ref 3.77–5.28)
RDW: 12 % (ref 11.7–15.4)
WBC: 10.8 x10E3/uL (ref 3.4–10.8)

## 2024-03-24 LAB — CMP14+EGFR
ALT: 17 IU/L (ref 0–32)
AST: 19 IU/L (ref 0–40)
Albumin: 4 g/dL (ref 3.9–4.9)
Alkaline Phosphatase: 159 IU/L — ABNORMAL HIGH (ref 49–135)
BUN/Creatinine Ratio: 21 (ref 12–28)
BUN: 21 mg/dL (ref 8–27)
Bilirubin Total: 0.4 mg/dL (ref 0.0–1.2)
CO2: 24 mmol/L (ref 20–29)
Calcium: 9.6 mg/dL (ref 8.7–10.3)
Chloride: 103 mmol/L (ref 96–106)
Creatinine, Ser: 1.01 mg/dL — ABNORMAL HIGH (ref 0.57–1.00)
Globulin, Total: 3.4 g/dL (ref 1.5–4.5)
Glucose: 117 mg/dL — ABNORMAL HIGH (ref 70–99)
Potassium: 4.5 mmol/L (ref 3.5–5.2)
Sodium: 140 mmol/L (ref 134–144)
Total Protein: 7.4 g/dL (ref 6.0–8.5)
eGFR: 61 mL/min/1.73 (ref >59)

## 2024-03-24 LAB — LIPID PANEL W/O CHOL/HDL RATIO
Cholesterol, Total: 155 mg/dL (ref 100–199)
HDL: 57 mg/dL (ref >39)
LDL Chol Calc (NIH): 79 mg/dL (ref 0–99)
Triglycerides: 104 mg/dL (ref 0–149)
VLDL Cholesterol Cal: 19 mg/dL (ref 5–40)

## 2024-03-24 LAB — TSH+T4F+T3FREE
Free T4: 1.16 ng/dL (ref 0.82–1.77)
T3, Free: 2.9 pg/mL (ref 2.0–4.4)
TSH: 1.88 u[IU]/mL (ref 0.450–4.500)

## 2024-03-24 LAB — HEMOGLOBIN A1C
Est. average glucose Bld gHb Est-mCnc: 151 mg/dL
Hgb A1c MFr Bld: 6.9 % — ABNORMAL HIGH (ref 4.8–5.6)

## 2024-03-26 ENCOUNTER — Other Ambulatory Visit: Payer: Self-pay

## 2024-03-26 MED ORDER — LISINOPRIL 10 MG PO TABS: 20.0000 mg | ORAL_TABLET | Freq: Every day | ORAL | 3 refills | Status: AC

## 2024-04-05 ENCOUNTER — Other Ambulatory Visit: Payer: Self-pay | Admitting: Internal Medicine

## 2024-04-05 ENCOUNTER — Ambulatory Visit (INDEPENDENT_AMBULATORY_CARE_PROVIDER_SITE_OTHER): Admitting: Internal Medicine

## 2024-04-05 ENCOUNTER — Encounter: Payer: Self-pay | Admitting: Internal Medicine

## 2024-04-05 VITALS — BP 128/78 | HR 100 | Ht 66.0 in | Wt 343.0 lb

## 2024-04-05 DIAGNOSIS — E1159 Type 2 diabetes mellitus with other circulatory complications: Secondary | ICD-10-CM

## 2024-04-05 DIAGNOSIS — E1165 Type 2 diabetes mellitus with hyperglycemia: Secondary | ICD-10-CM | POA: Diagnosis not present

## 2024-04-05 DIAGNOSIS — E782 Mixed hyperlipidemia: Secondary | ICD-10-CM | POA: Diagnosis not present

## 2024-04-05 DIAGNOSIS — E119 Type 2 diabetes mellitus without complications: Secondary | ICD-10-CM

## 2024-04-05 DIAGNOSIS — I152 Hypertension secondary to endocrine disorders: Secondary | ICD-10-CM | POA: Diagnosis not present

## 2024-04-05 DIAGNOSIS — E039 Hypothyroidism, unspecified: Secondary | ICD-10-CM

## 2024-04-05 DIAGNOSIS — E1169 Type 2 diabetes mellitus with other specified complication: Secondary | ICD-10-CM

## 2024-04-05 LAB — POC CREATINE & ALBUMIN,URINE
Albumin/Creatinine Ratio, Urine, POC: <30
Creatinine, POC: 200 mg/dL
Microalbumin Ur, POC: 30 mg/L

## 2024-04-05 LAB — POCT CBG (FASTING - GLUCOSE)-MANUAL ENTRY: Glucose Fasting, POC: 108 mg/dL — AB (ref 70–99)

## 2024-04-05 NOTE — Progress Notes (Signed)
 Established Patient Office Visit  Subjective:  Patient ID: Calleigh Pacheco, female    DOB: 01/15/1956  Age: 68 y.o. MRN: [id number]  Chief Complaint  Patient presents with   Follow-up    2 week follow up    Patient is here today for follow up. She reports she is doing well and has no new complaints. She reports no GI distress since increasing Ozempic to 1 mg weekly injection. She also reports no more hypoglycemia events since moving glipizide to night time dosing. Her fasting blood sugar ranges she reports 90-120s. She endorses cutting out concentrated sweets since her last visit. She has managed to loose a few pounds as well and is in good spirits about her improvements she is seeing in her health.  We started patient on hydrochlorothiazide 12.5 mg at her last visit. Her blood pressure today is stable and she endorses improvement in her BLE edema she was having. Will check BMP today to monitor her electrolytes.  Discussed patients labs in detail and reinforced healthy diet and exercise as tolerated.    No other concerns at this time.   Past Medical History:  Diagnosis Date   Diabetes mellitus without complication (HCC)     Past Surgical History:  Procedure Laterality Date   BREAST BIOPSY Right    early 2000, benign   TONSILLECTOMY      Social History   Socioeconomic History   Marital status: Single    Spouse name: Not on file   Number of children: Not on file   Years of education: Not on file   Highest education level: Not on file  Occupational History   Not on file  Tobacco Use   Smoking status: Never    Passive exposure: Never   Smokeless tobacco: Never  Vaping Use   Vaping status: Never Used  Substance and Sexual Activity   Alcohol use: Never   Drug use: Never   Sexual activity: Not on file  Other Topics Concern   Not on file  Social History Narrative   Not on file   Social Drivers of Health   Financial Resource Strain: Low Risk  (09/21/2023)    Received from [facility]   Overall Financial Resource Strain (CARDIA)    Difficulty of Paying Living Expenses: Not hard at all  Food Insecurity: No Food Insecurity (09/21/2023)   Received from [facility]   Hunger Vital Sign    Within the past 12 months, you worried that your food would run out before you got the money to buy more.: Never true    Within the past 12 months, the food you bought just didn't last and you didn't have money to get more.: Never true  Transportation Needs: No Transportation Needs (09/21/2023)   Received from [facility] - Transportation    In the past 12 months, has lack of transportation kept you from medical appointments or from getting medications?: No    Lack of Transportation (Non-Medical): No  Physical Activity: Not on file  Stress: Not on file  Social Connections: Not on file  Intimate Partner Violence: Not on file    Family History  Problem Relation Age of Onset   Colon cancer Father    Breast cancer Neg Hx     No Known Allergies  Outpatient Medications Prior to Visit  Medication Sig   atorvastatin (LIPITOR) 10 MG tablet Take 10 mg by mouth daily.   balsalazide (COLAZAL) 750  MG capsule Take 750 mg by mouth 3 (three) times daily.   cetirizine (ZYRTEC) 5 MG tablet Take 5 mg by mouth daily.   glipiZIDE (GLUCOTROL XL) 5 MG 24 hr tablet Take 5 mg by mouth daily. (Patient taking differently: Take 0.5 tablets by mouth daily.)   hydrochlorothiazide (MICROZIDE) 12.5 MG capsule Take 1 capsule (12.5 mg total) by mouth daily.   JARDIANCE 25 MG TABS tablet Take 25 mg by mouth daily.   lisinopril (ZESTRIL) 10 MG tablet Take 2 tablets (20 mg total) by mouth daily.   meloxicam (MOBIC) 15 MG tablet Take 15 mg by mouth daily.   Multiple Vitamin (MULTIVITAMIN) tablet Take 1 tablet by mouth daily.   OneTouch Delica Lancets 30G MISC    ONETOUCH VERIO test strip 1 each daily.   Semaglutide, 1 MG/DOSE,  (OZEMPIC, 1 MG/DOSE,) 2 MG/1.5ML SOPN Inject 1 mg into the skin once a week.   No facility-administered medications prior to visit.    Review of Systems  Constitutional: Negative.  Negative for chills, fever and malaise/fatigue.  HENT: Negative.  Negative for congestion and sore throat.   Eyes: Negative.  Negative for blurred vision and pain.  Respiratory: Negative.  Negative for cough and shortness of breath.   Cardiovascular: Negative.  Negative for chest pain, palpitations and leg swelling.  Gastrointestinal: Negative.  Negative for abdominal pain, blood in stool, constipation, diarrhea, heartburn, melena, nausea and vomiting.  Genitourinary: Negative.  Negative for dysuria, flank pain, frequency and urgency.  Musculoskeletal: Negative.  Negative for joint pain and myalgias.  Skin: Negative.   Neurological: Negative.  Negative for dizziness, tingling, sensory change, weakness and headaches.  Endo/Heme/Allergies: Negative.   Psychiatric/Behavioral: Negative.  Negative for depression and suicidal ideas. The patient is not nervous/anxious.        Objective:   BP 128/78   Pulse 100   Ht 5' 6 (1.676 m)   Wt (!) 343 lb (155.6 kg)   SpO2 95%   BMI 55.36 kg/m   Vitals:   04/05/24 1410  BP: 128/78  Pulse: 100  Height: 5' 6 (1.676 m)  Weight: (!) 343 lb (155.6 kg)  SpO2: 95%  BMI (Calculated): 55.39    Physical Exam Vitals and nursing note reviewed.  Constitutional:      Appearance: Normal appearance.  HENT:     Head: Normocephalic and atraumatic.     Nose: Nose normal.     Mouth/Throat:     Mouth: Mucous membranes are moist.     Pharynx: Oropharynx is clear.  Eyes:     Conjunctiva/sclera: Conjunctivae normal.     Pupils: Pupils are equal, round, and reactive to light.  Cardiovascular:     Rate and Rhythm: Normal rate and regular rhythm.     Pulses: Normal pulses.     Heart sounds: Normal heart sounds. No murmur heard. Pulmonary:     Effort: Pulmonary effort is  normal.     Breath sounds: Normal breath sounds. No wheezing.  Abdominal:     General: Bowel sounds are normal.     Palpations: Abdomen is soft.     Tenderness: There is no abdominal tenderness. There is no right CVA tenderness or left CVA tenderness.  Musculoskeletal:        General: Normal range of motion.     Cervical back: Normal range of motion.     Right lower leg: No edema.     Left lower leg: No edema.  Skin:    General: Skin is  warm and dry.  Neurological:     General: No focal deficit present.     Mental Status: She is alert and oriented to person, place, and time.  Psychiatric:        Mood and Affect: Mood normal.        Behavior: Behavior normal.      Results for orders placed or performed in visit on 04/05/24  POCT CBG (Fasting - Glucose)  Result Value Ref Range   Glucose Fasting, POC 108 (A) 70 - 99 mg/dL  POC CREATINE & ALBUMIN,URINE  Result Value Ref Range   Microalbumin Ur, POC 30 mg/L   Creatinine, POC 200 mg/dL   Albumin/Creatinine Ratio, Urine, POC <30     Recent Results (from the past 2160 hours)  POCT CBG (Fasting - Glucose)     Status: Abnormal   Collection Time: 03/23/24  1:47 PM  Result Value Ref Range   Glucose Fasting, POC 132 (A) 70 - 99 mg/dL  CBC with Diff     Status: Abnormal   Collection Time: 03/23/24  2:37 PM  Result Value Ref Range   WBC 10.8 3.4 - 10.8 x10E3/uL   RBC 4.46 3.77 - 5.28 x10E6/uL   Hemoglobin 14.5 11.1 - 15.9 g/dL   Hematocrit 56.8 65.9 - 46.6 %   MCV 97 79 - 97 fL   MCH 32.5 26.6 - 33.0 pg   MCHC 33.6 31.5 - 35.7 g/dL   RDW 87.9 88.2 - 84.5 %   Platelets 335 150 - 450 x10E3/uL   Neutrophils 67 Not Estab. %   Lymphs 21 Not Estab. %   Monocytes 7 Not Estab. %   Eos 4 Not Estab. %   Basos 1 Not Estab. %   Neutrophils Absolute 7.2 (H) 1.4 - 7.0 x10E3/uL   Lymphocytes Absolute 2.3 0.7 - 3.1 x10E3/uL   Monocytes Absolute 0.8 0.1 - 0.9 x10E3/uL   EOS (ABSOLUTE) 0.5 (H) 0.0 - 0.4 x10E3/uL   Basophils Absolute 0.1  0.0 - 0.2 x10E3/uL   Immature Granulocytes 0 Not Estab. %   Immature Grans (Abs) 0.0 0.0 - 0.1 x10E3/uL  CMP14+EGFR     Status: Abnormal   Collection Time: 03/23/24  2:37 PM  Result Value Ref Range   Glucose 117 (H) 70 - 99 mg/dL   BUN 21 8 - 27 mg/dL   Creatinine, Ser 8.98 (H) 0.57 - 1.00 mg/dL   eGFR 61 >40 fO/fpw/8.26   BUN/Creatinine Ratio 21 12 - 28   Sodium 140 134 - 144 mmol/L   Potassium 4.5 3.5 - 5.2 mmol/L   Chloride 103 96 - 106 mmol/L   CO2 24 20 - 29 mmol/L   Calcium 9.6 8.7 - 10.3 mg/dL   Total Protein 7.4 6.0 - 8.5 g/dL   Albumin 4.0 3.9 - 4.9 g/dL   Globulin, Total 3.4 1.5 - 4.5 g/dL   Bilirubin Total 0.4 0.0 - 1.2 mg/dL   Alkaline Phosphatase 159 (H) 49 - 135 IU/L   AST 19 0 - 40 IU/L   ALT 17 0 - 32 IU/L  Lipid Panel w/o Chol/HDL Ratio     Status: None   Collection Time: 03/23/24  2:37 PM  Result Value Ref Range   Cholesterol, Total 155 100 - 199 mg/dL   Triglycerides 895 0 - 149 mg/dL   HDL 57 >60 mg/dL   VLDL Cholesterol Cal 19 5 - 40 mg/dL   LDL Chol Calc (NIH) 79 0 - 99 mg/dL  Hemoglobin J8r  Status: Abnormal   Collection Time: 03/23/24  2:37 PM  Result Value Ref Range   Hgb A1c MFr Bld 6.9 (H) 4.8 - 5.6 %    Comment:          Prediabetes: 5.7 - 6.4          Diabetes: >6.4          Glycemic control for adults with diabetes: <7.0    Est. average glucose Bld gHb Est-mCnc 151 mg/dL  [id number]     Status: None   Collection Time: 03/23/24  2:37 PM  Result Value Ref Range   TSH 1.880 0.450 - 4.500 uIU/mL   T3, Free 2.9 2.0 - 4.4 pg/mL   Free T4 1.16 0.82 - 1.77 ng/dL  POCT CBG (Fasting - Glucose)     Status: Abnormal   Collection Time: 04/05/24  2:17 PM  Result Value Ref Range   Glucose Fasting, POC 108 (A) 70 - 99 mg/dL  POC CREATINE & ALBUMIN,URINE     Status: Normal   Collection Time: 04/05/24  2:53 PM  Result Value Ref Range   Microalbumin Ur, POC 30 mg/L   Creatinine, POC 200 mg/dL   Albumin/Creatinine Ratio, Urine, POC <30        Assessment & Plan:  Continue taking medications as prescribed. Check BMP today. Reinforced healthy diet and exercise as tolerated. Check UAC today. Problem List Items Addressed This Visit     Diabetes mellitus type 2, uncomplicated (HCC) - Primary   Relevant Orders   POCT CBG (Fasting - Glucose) (Completed)   POC CREATINE & ALBUMIN,URINE (Completed)   Morbid obesity with BMI of 48.3, adult (HCC)   Combined hyperlipidemia associated with type 2 diabetes mellitus (HCC)   Acquired hypothyroidism   Hypertension associated with diabetes (HCC)   Relevant Orders   Basic metabolic panel with GFR    Return in about 3 months (around 07/06/2024).   Total time spent: 25 minutes. This time includes review of previous notes and results and patient face to face interaction during today's visit.    FERNAND FREDY RAMAN, MD  04/05/2024   This document may have been prepared by [facility] Voice Recognition software and as such may include unintentional dictation errors.

## 2024-04-06 ENCOUNTER — Ambulatory Visit: Payer: Self-pay | Admitting: Internal Medicine

## 2024-04-06 LAB — BASIC METABOLIC PANEL WITH GFR
BUN/Creatinine Ratio: 20 (ref 12–28)
BUN: 29 mg/dL — ABNORMAL HIGH (ref 8–27)
CO2: 22 mmol/L (ref 20–29)
Calcium: 10.2 mg/dL (ref 8.7–10.3)
Chloride: 101 mmol/L (ref 96–106)
Creatinine, Ser: 1.44 mg/dL — ABNORMAL HIGH (ref 0.57–1.00)
Glucose: 113 mg/dL — ABNORMAL HIGH (ref 70–99)
Potassium: 4.6 mmol/L (ref 3.5–5.2)
Sodium: 140 mmol/L (ref 134–144)
eGFR: 40 mL/min/1.73 — ABNORMAL LOW (ref >59)

## 2024-04-06 NOTE — Progress Notes (Signed)
 Patient notified

## 2024-04-16 ENCOUNTER — Other Ambulatory Visit: Payer: Self-pay

## 2024-04-16 DIAGNOSIS — E119 Type 2 diabetes mellitus without complications: Secondary | ICD-10-CM

## 2024-04-16 DIAGNOSIS — I152 Hypertension secondary to endocrine disorders: Secondary | ICD-10-CM

## 2024-04-16 MED ORDER — OZEMPIC (1 MG/DOSE) 2 MG/1.5ML ~~LOC~~ SOPN: 1.0000 mg | PEN_INJECTOR | SUBCUTANEOUS | 1 refills | Status: DC

## 2024-04-16 MED ORDER — HYDROCHLOROTHIAZIDE 12.5 MG PO CAPS: 12.5000 mg | ORAL_CAPSULE | Freq: Every day | ORAL | 3 refills | Status: AC

## 2024-04-28 NOTE — Progress Notes (Signed)
   04/28/2024  Patient ID: Tracy Pacheco, female   DOB: March 22, 1956, 68 y.o.   MRN: [id number]  Pharmacy Quality Measure Review  This patient is appearing on a report for being at risk of failing the KED measure this calendar year.   Last GFR and uACR obtained 04/05/24.  Insurance report was not up to date, no further action needed at this time.  Jon VEAR Lindau, PharmD Clinical Pharmacist [phone]

## 2024-05-12 ENCOUNTER — Other Ambulatory Visit: Payer: Self-pay | Admitting: Internal Medicine

## 2024-05-12 DIAGNOSIS — E119 Type 2 diabetes mellitus without complications: Secondary | ICD-10-CM

## 2024-06-06 ENCOUNTER — Other Ambulatory Visit: Payer: Self-pay | Admitting: Internal Medicine

## 2024-06-06 DIAGNOSIS — E119 Type 2 diabetes mellitus without complications: Secondary | ICD-10-CM

## 2024-06-15 ENCOUNTER — Ambulatory Visit: Admitting: Internal Medicine

## 2024-06-15 ENCOUNTER — Ambulatory Visit: Payer: Self-pay | Admitting: Internal Medicine

## 2024-06-15 ENCOUNTER — Encounter: Payer: Self-pay | Admitting: Internal Medicine

## 2024-06-15 ENCOUNTER — Other Ambulatory Visit: Payer: Self-pay | Admitting: Internal Medicine

## 2024-06-15 ENCOUNTER — Telehealth: Payer: Self-pay

## 2024-06-15 VITALS — BP 138/82 | HR 112 | Ht 66.0 in | Wt 337.0 lb

## 2024-06-15 DIAGNOSIS — R053 Chronic cough: Secondary | ICD-10-CM

## 2024-06-15 DIAGNOSIS — J069 Acute upper respiratory infection, unspecified: Secondary | ICD-10-CM | POA: Diagnosis not present

## 2024-06-15 DIAGNOSIS — J209 Acute bronchitis, unspecified: Secondary | ICD-10-CM

## 2024-06-15 DIAGNOSIS — I152 Hypertension secondary to endocrine disorders: Secondary | ICD-10-CM | POA: Diagnosis not present

## 2024-06-15 DIAGNOSIS — E782 Mixed hyperlipidemia: Secondary | ICD-10-CM | POA: Diagnosis not present

## 2024-06-15 DIAGNOSIS — E1165 Type 2 diabetes mellitus with hyperglycemia: Secondary | ICD-10-CM | POA: Diagnosis not present

## 2024-06-15 DIAGNOSIS — Z6841 Body Mass Index (BMI) 40.0 and over, adult: Secondary | ICD-10-CM | POA: Diagnosis not present

## 2024-06-15 DIAGNOSIS — E1159 Type 2 diabetes mellitus with other circulatory complications: Secondary | ICD-10-CM

## 2024-06-15 DIAGNOSIS — E1169 Type 2 diabetes mellitus with other specified complication: Secondary | ICD-10-CM | POA: Diagnosis not present

## 2024-06-15 DIAGNOSIS — B974 Respiratory syncytial virus as the cause of diseases classified elsewhere: Secondary | ICD-10-CM

## 2024-06-15 DIAGNOSIS — E119 Type 2 diabetes mellitus without complications: Secondary | ICD-10-CM

## 2024-06-15 LAB — POCT XPERT XPRESS SARS COVID-2/FLU/RSV
FLU A: NEGATIVE
FLU B: NEGATIVE
RSV RNA, PCR: POSITIVE
SARS Coronavirus 2: NEGATIVE

## 2024-06-15 MED ORDER — METHYLPREDNISOLONE 4 MG PO TBPK: ORAL_TABLET | ORAL | 0 refills | Status: DC

## 2024-06-15 MED ORDER — GUAIFENESIN-CODEINE 100-6.33 MG/5ML PO SOLN: ORAL | 0 refills | Status: DC

## 2024-06-15 MED ORDER — AMOXICILLIN-POT CLAVULANATE 875-125 MG PO TABS: 1.0000 | ORAL_TABLET | Freq: Two times a day (BID) | ORAL | 0 refills | Status: DC

## 2024-06-15 MED ORDER — PSEUDOEPHEDRINE-CODEINE-GG 30-10-100 MG/5ML PO SOLN: 5.0000 mL | Freq: Two times a day (BID) | ORAL | 0 refills | Status: DC

## 2024-06-15 NOTE — Telephone Encounter (Signed)
 Pt LM asking about her cough syrup, she said the pharmacy told her its not covered and she would liek somethiing different called in

## 2024-06-15 NOTE — Progress Notes (Signed)
 "  Established Patient Office Visit  Subjective:  Patient ID: Tracy Pacheco, female    DOB: 1955-10-08  Age: 69 y.o. MRN: [id number]  Chief Complaint  Patient presents with   Acute Visit    Started Sunday, cough, headache, nasal drainage, chest and nasal congestion, clear flem, fatigue.     Patient comes in with 5 day history of progressively worsening cough, chest congestion, mild shortness of breath and wheezing. Denies fevers, but mild fatigue and chills. No body aches. No exposure or travel history. Patient reports her cough is getting worse, and produced some colored sputum. O/e chest is tight with mild expiratory wheezing. Start po Augmentin , medrol  dose pk- cough syrup- may need inhaler. Check for covid/flu/rsv.    No other concerns at this time.   Past Medical History:  Diagnosis Date   Diabetes mellitus without complication (HCC)     Past Surgical History:  Procedure Laterality Date   BREAST BIOPSY Right    early 2000, benign   TONSILLECTOMY      Social History   Socioeconomic History   Marital status: Single    Spouse name: Not on file   Number of children: Not on file   Years of education: Not on file   Highest education level: Not on file  Occupational History   Not on file  Tobacco Use   Smoking status: Never    Passive exposure: Never   Smokeless tobacco: Never  Vaping Use   Vaping status: Never Used  Substance and Sexual Activity   Alcohol use: Never   Drug use: Never   Sexual activity: Not on file  Other Topics Concern   Not on file  Social History Narrative   Not on file   Social Drivers of Health   Tobacco Use: Low Risk (06/15/2024)   Patient History    Smoking Tobacco Use: Never    Smokeless Tobacco Use: Never    Passive Exposure: Never  Financial Resource Strain: Low Risk  (09/21/2023)   Received from [facility]   Overall Financial Resource Strain (CARDIA)    Difficulty of Paying Living Expenses: Not hard at all   Food Insecurity: No Food Insecurity (09/21/2023)   Received from [facility]   Epic    Within the past 12 months, you worried that your food would run out before you got the money to buy more.: Never true    Within the past 12 months, the food you bought just didn't last and you didn't have money to get more.: Never true  Transportation Needs: No Transportation Needs (09/21/2023)   Received from [facility] - Transportation    In the past 12 months, has lack of transportation kept you from medical appointments or from getting medications?: No    Lack of Transportation (Non-Medical): No  Physical Activity: Not on file  Stress: Not on file  Social Connections: Not on file  Intimate Partner Violence: Not on file  Depression (EYV7-0): Not on file  Alcohol Screen: Not on file  Housing: Low Risk  (09/21/2023)   Received from [facility]   Epic    In the last 12 months, was there a time when you were not able to pay the mortgage or rent on time?: No    In the past 12 months, how many times have you moved where you were living?: 0    At any time in the past 12 months, were you  homeless or living in a shelter (including now)?: No  Utilities: Not At Risk (09/21/2023)   Received from [facility] Utilities    Threatened with loss of utilities: No  Health Literacy: Not on file    Family History  Problem Relation Age of Onset   Colon cancer Father    Breast cancer Neg Hx     Allergies[1]  Show/hide medication list[2]  Review of Systems  Constitutional:  Positive for chills and malaise/fatigue. Negative for fever.  HENT:  Positive for congestion. Negative for sore throat.   Eyes: Negative.  Negative for blurred vision and pain.  Respiratory:  Positive for cough, sputum production, shortness of breath and wheezing.   Cardiovascular: Negative.  Negative for chest pain, palpitations and leg swelling.   Gastrointestinal: Negative.  Negative for abdominal pain, blood in stool, constipation, diarrhea, heartburn, melena, nausea and vomiting.  Genitourinary: Negative.  Negative for dysuria, flank pain, frequency and urgency.  Musculoskeletal: Negative.  Negative for joint pain and myalgias.  Skin: Negative.   Neurological: Negative.  Negative for dizziness, tingling, sensory change, weakness and headaches.  Endo/Heme/Allergies: Negative.   Psychiatric/Behavioral: Negative.  Negative for depression and suicidal ideas. The patient is not nervous/anxious.        Objective:   BP 138/82   Pulse (!) 112   Ht 5' 6 (1.676 m)   Wt (!) 337 lb (152.9 kg)   SpO2 91%   BMI 54.39 kg/m   Vitals:   06/15/24 1330  BP: 138/82  Pulse: (!) 112  Height: 5' 6 (1.676 m)  Weight: (!) 337 lb (152.9 kg)  SpO2: 91%  BMI (Calculated): 54.42    Physical Exam Vitals and nursing note reviewed.  Constitutional:      Appearance: Normal appearance.  HENT:     Head: Normocephalic and atraumatic.     Nose: Nose normal.     Mouth/Throat:     Mouth: Mucous membranes are moist.     Pharynx: Oropharynx is clear.  Eyes:     Conjunctiva/sclera: Conjunctivae normal.     Pupils: Pupils are equal, round, and reactive to light.  Cardiovascular:     Rate and Rhythm: Normal rate and regular rhythm.     Pulses: Normal pulses.     Heart sounds: Normal heart sounds. No murmur heard. Pulmonary:     Effort: Pulmonary effort is normal.     Breath sounds: Wheezing present.  Abdominal:     General: Bowel sounds are normal.     Palpations: Abdomen is soft.     Tenderness: There is no abdominal tenderness. There is no right CVA tenderness or left CVA tenderness.  Musculoskeletal:        General: Normal range of motion.     Cervical back: Normal range of motion.     Right lower leg: No edema.     Left lower leg: No edema.  Skin:    General: Skin is warm and dry.  Neurological:     General: No focal deficit  present.     Mental Status: She is alert and oriented to person, place, and time.  Psychiatric:        Mood and Affect: Mood normal.        Behavior: Behavior normal.      Results for orders placed or performed in visit on 06/15/24  POCT XPERT XPRESS SARS COVID-2/FLU/RSV [[id number]]  Result Value Ref Range   SARS Coronavirus 2 Negative  FLU A Negative    FLU B Negative    RSV RNA, PCR Positive     Recent Results (from the past 2160 hours)  POCT CBG (Fasting - Glucose)     Status: Abnormal   Collection Time: 03/23/24  1:47 PM  Result Value Ref Range   Glucose Fasting, POC 132 (A) 70 - 99 mg/dL  CBC with Diff     Status: Abnormal   Collection Time: 03/23/24  2:37 PM  Result Value Ref Range   WBC 10.8 3.4 - 10.8 x10E3/uL   RBC 4.46 3.77 - 5.28 x10E6/uL   Hemoglobin 14.5 11.1 - 15.9 g/dL   Hematocrit 56.8 65.9 - 46.6 %   MCV 97 79 - 97 fL   MCH 32.5 26.6 - 33.0 pg   MCHC 33.6 31.5 - 35.7 g/dL   RDW 87.9 88.2 - 84.5 %   Platelets 335 150 - 450 x10E3/uL   Neutrophils 67 Not Estab. %   Lymphs 21 Not Estab. %   Monocytes 7 Not Estab. %   Eos 4 Not Estab. %   Basos 1 Not Estab. %   Neutrophils Absolute 7.2 (H) 1.4 - 7.0 x10E3/uL   Lymphocytes Absolute 2.3 0.7 - 3.1 x10E3/uL   Monocytes Absolute 0.8 0.1 - 0.9 x10E3/uL   EOS (ABSOLUTE) 0.5 (H) 0.0 - 0.4 x10E3/uL   Basophils Absolute 0.1 0.0 - 0.2 x10E3/uL   Immature Granulocytes 0 Not Estab. %   Immature Grans (Abs) 0.0 0.0 - 0.1 x10E3/uL  CMP14+EGFR     Status: Abnormal   Collection Time: 03/23/24  2:37 PM  Result Value Ref Range   Glucose 117 (H) 70 - 99 mg/dL   BUN 21 8 - 27 mg/dL   Creatinine, Ser 8.98 (H) 0.57 - 1.00 mg/dL   eGFR 61 >40 fO/fpw/8.26   BUN/Creatinine Ratio 21 12 - 28   Sodium 140 134 - 144 mmol/L   Potassium 4.5 3.5 - 5.2 mmol/L   Chloride 103 96 - 106 mmol/L   CO2 24 20 - 29 mmol/L   Calcium 9.6 8.7 - 10.3 mg/dL   Total Protein 7.4 6.0 - 8.5 g/dL   Albumin 4.0 3.9 - 4.9 g/dL   Globulin, Total  3.4 1.5 - 4.5 g/dL   Bilirubin Total 0.4 0.0 - 1.2 mg/dL   Alkaline Phosphatase 159 (H) 49 - 135 IU/L   AST 19 0 - 40 IU/L   ALT 17 0 - 32 IU/L  Lipid Panel w/o Chol/HDL Ratio     Status: None   Collection Time: 03/23/24  2:37 PM  Result Value Ref Range   Cholesterol, Total 155 100 - 199 mg/dL   Triglycerides 895 0 - 149 mg/dL   HDL 57 >60 mg/dL   VLDL Cholesterol Cal 19 5 - 40 mg/dL   LDL Chol Calc (NIH) 79 0 - 99 mg/dL  Hemoglobin J8r     Status: Abnormal   Collection Time: 03/23/24  2:37 PM  Result Value Ref Range   Hgb A1c MFr Bld 6.9 (H) 4.8 - 5.6 %    Comment:          Prediabetes: 5.7 - 6.4          Diabetes: >6.4          Glycemic control for adults with diabetes: <7.0    Est. average glucose Bld gHb Est-mCnc 151 mg/dL  [id number]     Status: None   Collection Time: 03/23/24  2:37 PM  Result Value Ref  Range   TSH 1.880 0.450 - 4.500 uIU/mL   T3, Free 2.9 2.0 - 4.4 pg/mL   Free T4 1.16 0.82 - 1.77 ng/dL  POCT CBG (Fasting - Glucose)     Status: Abnormal   Collection Time: 04/05/24  2:17 PM  Result Value Ref Range   Glucose Fasting, POC 108 (A) 70 - 99 mg/dL  POC CREATINE & ALBUMIN,URINE     Status: Normal   Collection Time: 04/05/24  2:53 PM  Result Value Ref Range   Microalbumin Ur, POC 30 mg/L   Creatinine, POC 200 mg/dL   Albumin/Creatinine Ratio, Urine, POC <30   Basic metabolic panel with GFR     Status: Abnormal   Collection Time: 04/05/24  3:01 PM  Result Value Ref Range   Glucose 113 (H) 70 - 99 mg/dL   BUN 29 (H) 8 - 27 mg/dL   Creatinine, Ser 8.55 (H) 0.57 - 1.00 mg/dL   eGFR 40 (L) >40 fO/fpw/8.26   BUN/Creatinine Ratio 20 12 - 28   Sodium 140 134 - 144 mmol/L   Potassium 4.6 3.5 - 5.2 mmol/L   Chloride 101 96 - 106 mmol/L   CO2 22 20 - 29 mmol/L   Calcium 10.2 8.7 - 10.3 mg/dL  POCT XPERT XPRESS SARS COVID-2/FLU/RSV [[id number]]     Status: Abnormal   Collection Time: 06/15/24  2:21 PM  Result Value Ref Range   SARS Coronavirus 2 Negative     FLU A Negative    FLU B Negative    RSV RNA, PCR Positive       Assessment & Plan:  Continue meds- Rx sent. RSV -positive. Problem List Items Addressed This Visit       Cardiovascular and Mediastinum   Hypertension associated with diabetes (HCC)     Endocrine   Diabetes mellitus type 2, uncomplicated (HCC)   Combined hyperlipidemia associated with type 2 diabetes mellitus (HCC)     Other   Morbid obesity with BMI of 48.3, adult (HCC)   Other Visit Diagnoses       Acute bronchitis, unspecified organism    -  Primary   Relevant Medications   amoxicillin -clavulanate (AUGMENTIN ) 875-125 MG tablet   methylPREDNISolone  (MEDROL  DOSEPAK) 4 MG TBPK tablet   pseudoephedrine -codeine -guaifenesin  (MYTUSSIN DAC) 30-10-100 MG/5ML solution     Chronic coughing       Relevant Orders   POCT XPERT XPRESS SARS COVID-2/FLU/RSV [[id number]] (Completed)       Follow up 1 week.  Total time spent: 30 minutes. This time includes review of previous notes and results and patient face to face interaction during today's visit.    FERNAND FREDY RAMAN, MD  06/15/2024   This document may have been prepared by [facility] Voice Recognition software and as such may include unintentional dictation errors.     [1] No Known Allergies [2]  Outpatient Medications Prior to Visit  Medication Sig   atorvastatin (LIPITOR) 10 MG tablet Take 10 mg by mouth daily.   balsalazide (COLAZAL) 750 MG capsule Take 750 mg by mouth 3 (three) times daily.   cetirizine (ZYRTEC) 5 MG tablet Take 5 mg by mouth daily.   glipiZIDE (GLUCOTROL XL) 5 MG 24 hr tablet Take 5 mg by mouth daily. (Patient taking differently: Take 0.5 tablets by mouth daily.)   hydrochlorothiazide  (MICROZIDE ) 12.5 MG capsule Take 1 capsule (12.5 mg total) by mouth daily.   JARDIANCE 25 MG TABS tablet TAKE 1 TABLET BY MOUTH EVERY DAY   lisinopril  (ZESTRIL ) 10  MG tablet Take 2 tablets (20 mg total) by mouth daily.   meloxicam (MOBIC) 15 MG tablet Take  15 mg by mouth daily.   Multiple Vitamin (MULTIVITAMIN) tablet Take 1 tablet by mouth daily.   OneTouch Delica Lancets 30G MISC    ONETOUCH VERIO test strip 1 each daily.   Semaglutide , 1 MG/DOSE, (OZEMPIC , 1 MG/DOSE,) 4 MG/3ML SOPN INJECT 1MG  UNDER THE SKIN ONE TIME WEEKLY   No facility-administered medications prior to visit.   "

## 2024-06-16 ENCOUNTER — Ambulatory Visit

## 2024-06-18 ENCOUNTER — Other Ambulatory Visit: Payer: Self-pay | Admitting: Internal Medicine

## 2024-06-19 ENCOUNTER — Other Ambulatory Visit: Payer: Self-pay | Admitting: Internal Medicine

## 2024-06-19 DIAGNOSIS — J209 Acute bronchitis, unspecified: Secondary | ICD-10-CM

## 2024-06-19 MED ORDER — GUAIFENESIN-CODEINE 100-10 MG/5ML PO SOLN: 5.0000 mL | Freq: Two times a day (BID) | ORAL | 0 refills | Status: DC

## 2024-06-19 NOTE — Telephone Encounter (Signed)
 Patient informed and verbalized understanding

## 2024-06-22 ENCOUNTER — Ambulatory Visit: Admitting: Internal Medicine

## 2024-06-22 ENCOUNTER — Encounter: Payer: Self-pay | Admitting: Internal Medicine

## 2024-06-22 VITALS — BP 120/84 | HR 111 | Ht 66.0 in | Wt 329.4 lb

## 2024-06-22 DIAGNOSIS — I152 Hypertension secondary to endocrine disorders: Secondary | ICD-10-CM | POA: Diagnosis not present

## 2024-06-22 DIAGNOSIS — E1165 Type 2 diabetes mellitus with hyperglycemia: Secondary | ICD-10-CM

## 2024-06-22 DIAGNOSIS — E782 Mixed hyperlipidemia: Secondary | ICD-10-CM

## 2024-06-22 DIAGNOSIS — K5120 Ulcerative (chronic) proctitis without complications: Secondary | ICD-10-CM | POA: Diagnosis not present

## 2024-06-22 DIAGNOSIS — E1169 Type 2 diabetes mellitus with other specified complication: Secondary | ICD-10-CM

## 2024-06-22 DIAGNOSIS — E119 Type 2 diabetes mellitus without complications: Secondary | ICD-10-CM

## 2024-06-22 DIAGNOSIS — E1159 Type 2 diabetes mellitus with other circulatory complications: Secondary | ICD-10-CM | POA: Diagnosis not present

## 2024-06-22 DIAGNOSIS — Z6841 Body Mass Index (BMI) 40.0 and over, adult: Secondary | ICD-10-CM

## 2024-06-22 NOTE — Progress Notes (Signed)
 "  Established Patient Office Visit  Subjective:  Patient ID: Tracy Pacheco, female    DOB: 1955-08-26  Age: 69 y.o. MRN: [id number]  Chief Complaint  Patient presents with   Follow-up    1 week follow up    Patient comes in for her follow-up today.  She has completed Augmentin  and prednisone  Dosepak and her chest symptoms have improved she still has some mild residual cough but has a cough syrup that she can take.  Denies chest pain or shortness of breath, no fevers and no chills.  Patient is tolerating her Ozempic  at 1 mg/week and has lost weight as well.  Encouraged to continue strict diet control as well.   She is due for labs at the end of this month.    No other concerns at this time.   Past Medical History:  Diagnosis Date   Diabetes mellitus without complication (HCC)     Past Surgical History:  Procedure Laterality Date   BREAST BIOPSY Right    early 2000, benign   TONSILLECTOMY      Social History   Socioeconomic History   Marital status: Single    Spouse name: Not on file   Number of children: Not on file   Years of education: Not on file   Highest education level: Not on file  Occupational History   Not on file  Tobacco Use   Smoking status: Never    Passive exposure: Never   Smokeless tobacco: Never  Vaping Use   Vaping status: Never Used  Substance and Sexual Activity   Alcohol use: Never   Drug use: Never   Sexual activity: Not on file  Other Topics Concern   Not on file  Social History Narrative   Not on file   Social Drivers of Health   Tobacco Use: Low Risk (06/22/2024)   Patient History    Smoking Tobacco Use: Never    Smokeless Tobacco Use: Never    Passive Exposure: Never  Financial Resource Strain: Low Risk  (09/21/2023)   Received from [facility]   Overall Financial Resource Strain (CARDIA)    Difficulty of Paying Living Expenses: Not hard at all  Food Insecurity: No Food Insecurity (09/21/2023)   Received from  [facility]   Epic    Within the past 12 months, you worried that your food would run out before you got the money to buy more.: Never true    Within the past 12 months, the food you bought just didn't last and you didn't have money to get more.: Never true  Transportation Needs: No Transportation Needs (09/21/2023)   Received from [facility] - Transportation    In the past 12 months, has lack of transportation kept you from medical appointments or from getting medications?: No    Lack of Transportation (Non-Medical): No  Physical Activity: Not on file  Stress: Not on file  Social Connections: Not on file  Intimate Partner Violence: Not on file  Depression (EYV7-0): Not on file  Alcohol Screen: Not on file  Housing: Low Risk  (09/21/2023)   Received from [facility]   Epic    In the last 12 months, was there a time when you were not able to pay the mortgage or rent on time?: No    In the past 12 months, how many times have you moved where you were living?: 0    At any  time in the past 12 months, were you homeless or living in a shelter (including now)?: No  Utilities: Not At Risk (09/21/2023)   Received from [facility] Utilities    Threatened with loss of utilities: No  Health Literacy: Not on file    Family History  Problem Relation Age of Onset   Colon cancer Father    Breast cancer Neg Hx     Allergies[1]  Show/hide medication list[2]  Review of Systems  Constitutional: Negative.  Negative for chills, fever and malaise/fatigue.  HENT: Negative.  Negative for congestion and sore throat.   Eyes: Negative.  Negative for blurred vision and pain.  Respiratory:  Positive for cough. Negative for shortness of breath.   Cardiovascular: Negative.  Negative for chest pain, palpitations and leg swelling.  Gastrointestinal: Negative.  Negative for abdominal pain, blood in stool, constipation,  diarrhea, heartburn, melena, nausea and vomiting.  Genitourinary: Negative.  Negative for dysuria, flank pain, frequency and urgency.  Musculoskeletal: Negative.  Negative for joint pain and myalgias.  Skin: Negative.   Neurological: Negative.  Negative for dizziness, tingling, sensory change, weakness and headaches.  Endo/Heme/Allergies: Negative.   Psychiatric/Behavioral: Negative.  Negative for depression and suicidal ideas. The patient is not nervous/anxious.        Objective:   BP 120/84   Pulse (!) 111   Ht 5' 6 (1.676 m)   Wt (!) 329 lb 6.4 oz (149.4 kg)   SpO2 96%   BMI 53.17 kg/m   Vitals:   06/22/24 1542  BP: 120/84  Pulse: (!) 111  Height: 5' 6 (1.676 m)  Weight: (!) 329 lb 6.4 oz (149.4 kg)  SpO2: 96%  BMI (Calculated): 53.19    Physical Exam Vitals and nursing note reviewed.  Constitutional:      Appearance: Normal appearance.  HENT:     Head: Normocephalic and atraumatic.     Nose: Nose normal.     Mouth/Throat:     Mouth: Mucous membranes are moist.     Pharynx: Oropharynx is clear.  Eyes:     Conjunctiva/sclera: Conjunctivae normal.     Pupils: Pupils are equal, round, and reactive to light.  Cardiovascular:     Rate and Rhythm: Normal rate and regular rhythm.     Pulses: Normal pulses.     Heart sounds: Normal heart sounds. No murmur heard. Pulmonary:     Effort: Pulmonary effort is normal.     Breath sounds: Normal breath sounds. No wheezing.  Abdominal:     General: Bowel sounds are normal.     Palpations: Abdomen is soft.     Tenderness: There is no abdominal tenderness. There is no right CVA tenderness or left CVA tenderness.  Musculoskeletal:        General: Normal range of motion.     Cervical back: Normal range of motion.     Right lower leg: No edema.     Left lower leg: No edema.  Skin:    General: Skin is warm and dry.  Neurological:     General: No focal deficit present.     Mental Status: She is alert and oriented to  person, place, and time.  Psychiatric:        Mood and Affect: Mood normal.        Behavior: Behavior normal.      No results found for any visits on 06/22/24.  Recent Results (from the past 2160 hours)  POCT CBG (  Fasting - Glucose)     Status: Abnormal   Collection Time: 04/05/24  2:17 PM  Result Value Ref Range   Glucose Fasting, POC 108 (A) 70 - 99 mg/dL  POC CREATINE & ALBUMIN,URINE     Status: Normal   Collection Time: 04/05/24  2:53 PM  Result Value Ref Range   Microalbumin Ur, POC 30 mg/L   Creatinine, POC 200 mg/dL   Albumin/Creatinine Ratio, Urine, POC <30   Basic metabolic panel with GFR     Status: Abnormal   Collection Time: 04/05/24  3:01 PM  Result Value Ref Range   Glucose 113 (H) 70 - 99 mg/dL   BUN 29 (H) 8 - 27 mg/dL   Creatinine, Ser 8.55 (H) 0.57 - 1.00 mg/dL   eGFR 40 (L) >40 fO/fpw/8.26   BUN/Creatinine Ratio 20 12 - 28   Sodium 140 134 - 144 mmol/L   Potassium 4.6 3.5 - 5.2 mmol/L   Chloride 101 96 - 106 mmol/L   CO2 22 20 - 29 mmol/L   Calcium 10.2 8.7 - 10.3 mg/dL  POCT XPERT XPRESS SARS COVID-2/FLU/RSV [[id number]]     Status: Abnormal   Collection Time: 06/15/24  2:21 PM  Result Value Ref Range   SARS Coronavirus 2 Negative    FLU A Negative    FLU B Negative    RSV RNA, PCR Positive       Assessment & Plan:  Continue current medications.  Patient will get fasting labs before her next visit. 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  Total time spent: 30 minutes. This time includes review of previous notes and results and patient face to face interaction during today's visit.    FERNAND FREDY RAMAN, MD  06/22/2024   This document may have been prepared by [facility] Voice Recognition software and as such may include unintentional dictation errors.     [1] No Known Allergies [2]  Outpatient Medications Prior to Visit  Medication Sig   atorvastatin (LIPITOR) 10 MG tablet Take 10 mg by mouth daily.   balsalazide (COLAZAL) 750 MG capsule Take 750 mg by mouth 3 (three) times daily.   cetirizine (ZYRTEC) 5 MG tablet Take 5 mg by mouth daily.   glipiZIDE (GLUCOTROL XL) 5 MG 24 hr tablet Take 5 mg by mouth daily. (Patient taking differently: Take 0.5 tablets by mouth daily.)   guaiFENesin -codeine  100-10 MG/5ML syrup Take 5 mLs by mouth 2 (two) times daily.   hydrochlorothiazide  (MICROZIDE ) 12.5 MG capsule Take 1 capsule (12.5 mg total) by mouth daily.   JARDIANCE 25 MG TABS tablet TAKE 1 TABLET BY MOUTH EVERY DAY   lisinopril  (ZESTRIL ) 10 MG tablet Take 2 tablets (20 mg total) by mouth daily.   meloxicam (MOBIC) 15 MG tablet Take 15 mg by mouth daily.   Multiple Vitamin (MULTIVITAMIN) tablet  Take 1 tablet by mouth daily.   OneTouch Delica Lancets 30G MISC    ONETOUCH VERIO test strip 1 each daily.   Semaglutide , 1 MG/DOSE, (OZEMPIC , 1 MG/DOSE,) 4 MG/3ML SOPN INJECT 1MG  UNDER THE SKIN ONE TIME WEEKLY   [DISCONTINUED] amoxicillin -clavulanate (AUGMENTIN ) 875-125 MG tablet Take 1 tablet by mouth 2 (two) times daily. (Patient not taking: Reported on 06/22/2024)   [DISCONTINUED] methylPREDNISolone  (MEDROL  DOSEPAK) 4 MG TBPK tablet Use as directed (Patient not taking: Reported on 06/22/2024)   No facility-administered medications prior to visit.   "

## 2024-07-06 ENCOUNTER — Ambulatory Visit: Admitting: Internal Medicine

## 2024-07-10 ENCOUNTER — Ambulatory Visit

## 2024-07-10 VITALS — BP 134/86 | HR 87 | Ht 66.0 in | Wt 332.6 lb

## 2024-07-10 DIAGNOSIS — E1169 Type 2 diabetes mellitus with other specified complication: Secondary | ICD-10-CM

## 2024-07-10 DIAGNOSIS — I152 Hypertension secondary to endocrine disorders: Secondary | ICD-10-CM

## 2024-07-10 DIAGNOSIS — E119 Type 2 diabetes mellitus without complications: Secondary | ICD-10-CM

## 2024-07-10 DIAGNOSIS — E1159 Type 2 diabetes mellitus with other circulatory complications: Secondary | ICD-10-CM

## 2024-07-10 DIAGNOSIS — K5120 Ulcerative (chronic) proctitis without complications: Secondary | ICD-10-CM

## 2024-07-10 DIAGNOSIS — E782 Mixed hyperlipidemia: Secondary | ICD-10-CM

## 2024-07-10 DIAGNOSIS — Z6841 Body Mass Index (BMI) 40.0 and over, adult: Secondary | ICD-10-CM

## 2024-07-10 DIAGNOSIS — E039 Hypothyroidism, unspecified: Secondary | ICD-10-CM

## 2024-07-10 NOTE — Assessment & Plan Note (Signed)
-   Monitor with future labs.

## 2024-07-10 NOTE — Assessment & Plan Note (Signed)
-   Continue/ reinforced healthy diet and exercise as tolerated. - Continue medications as prescribed. - Check labs when fasting

## 2024-07-11 ENCOUNTER — Other Ambulatory Visit

## 2024-07-11 DIAGNOSIS — E119 Type 2 diabetes mellitus without complications: Secondary | ICD-10-CM

## 2024-07-11 DIAGNOSIS — E039 Hypothyroidism, unspecified: Secondary | ICD-10-CM

## 2024-07-11 DIAGNOSIS — E1169 Type 2 diabetes mellitus with other specified complication: Secondary | ICD-10-CM

## 2024-07-11 DIAGNOSIS — E1159 Type 2 diabetes mellitus with other circulatory complications: Secondary | ICD-10-CM

## 2024-07-12 ENCOUNTER — Other Ambulatory Visit: Payer: Self-pay

## 2024-07-12 ENCOUNTER — Ambulatory Visit: Payer: Self-pay

## 2024-07-12 DIAGNOSIS — E119 Type 2 diabetes mellitus without complications: Secondary | ICD-10-CM

## 2024-07-12 LAB — HEMOGLOBIN A1C
Est. average glucose Bld gHb Est-mCnc: 143 mg/dL
Hgb A1c MFr Bld: 6.6 % — ABNORMAL HIGH (ref 4.8–5.6)

## 2024-07-12 LAB — CBC WITH DIFFERENTIAL/PLATELET
Basophils Absolute: 0.1 10*3/uL (ref 0.0–0.2)
Basos: 1 %
EOS (ABSOLUTE): 0.7 10*3/uL — ABNORMAL HIGH (ref 0.0–0.4)
Eos: 8 %
Hematocrit: 41.3 % (ref 34.0–46.6)
Hemoglobin: 13.5 g/dL (ref 11.1–15.9)
Immature Grans (Abs): 0 10*3/uL (ref 0.0–0.1)
Immature Granulocytes: 0 %
Lymphocytes Absolute: 2.5 10*3/uL (ref 0.7–3.1)
Lymphs: 28 %
MCH: 32.1 pg (ref 26.6–33.0)
MCHC: 32.7 g/dL (ref 31.5–35.7)
MCV: 98 fL — ABNORMAL HIGH (ref 79–97)
Monocytes Absolute: 0.7 10*3/uL (ref 0.1–0.9)
Monocytes: 8 %
Neutrophils Absolute: 5 10*3/uL (ref 1.4–7.0)
Neutrophils: 55 %
Platelets: 327 10*3/uL (ref 150–450)
RBC: 4.2 x10E6/uL (ref 3.77–5.28)
RDW: 12 % (ref 11.7–15.4)
WBC: 8.9 10*3/uL (ref 3.4–10.8)

## 2024-07-12 LAB — CMP14+EGFR
ALT: 11 [IU]/L (ref 0–32)
AST: 21 [IU]/L (ref 0–40)
Albumin: 3.7 g/dL — ABNORMAL LOW (ref 3.9–4.9)
Alkaline Phosphatase: 120 [IU]/L (ref 49–135)
BUN/Creatinine Ratio: 19 (ref 12–28)
BUN: 19 mg/dL (ref 8–27)
Bilirubin Total: 0.4 mg/dL (ref 0.0–1.2)
CO2: 23 mmol/L (ref 20–29)
Calcium: 9.1 mg/dL (ref 8.7–10.3)
Chloride: 105 mmol/L (ref 96–106)
Creatinine, Ser: 1.02 mg/dL — ABNORMAL HIGH (ref 0.57–1.00)
Globulin, Total: 2.9 g/dL (ref 1.5–4.5)
Glucose: 97 mg/dL (ref 70–99)
Potassium: 4.9 mmol/L (ref 3.5–5.2)
Sodium: 141 mmol/L (ref 134–144)
Total Protein: 6.6 g/dL (ref 6.0–8.5)
eGFR: 61 mL/min/{1.73_m2}

## 2024-07-12 LAB — LIPID PANEL W/O CHOL/HDL RATIO
Cholesterol, Total: 130 mg/dL (ref 100–199)
HDL: 50 mg/dL
LDL Chol Calc (NIH): 65 mg/dL (ref 0–99)
Triglycerides: 76 mg/dL (ref 0–149)
VLDL Cholesterol Cal: 15 mg/dL (ref 5–40)

## 2024-07-12 LAB — TSH+T4F+T3FREE
Free T4: 1.14 ng/dL (ref 0.82–1.77)
T3, Free: 2.9 pg/mL (ref 2.0–4.4)
TSH: 1.83 u[IU]/mL (ref 0.450–4.500)

## 2024-07-12 MED ORDER — SEMAGLUTIDE(0.25 OR 0.5MG/DOS) 2 MG/3ML ~~LOC~~ SOPN: 2.0000 mg | PEN_INJECTOR | SUBCUTANEOUS | 3 refills | Status: DC

## 2024-07-12 MED ORDER — OZEMPIC (2 MG/DOSE) 8 MG/3ML ~~LOC~~ SOPN: 2.0000 mg | PEN_INJECTOR | SUBCUTANEOUS | 3 refills | Status: AC

## 2024-07-12 NOTE — Addendum Note (Signed)
 Addended by: ADINE NIPPER A on: 07/12/2024 11:12 AM   Modules accepted: Orders

## 2024-07-12 NOTE — Progress Notes (Signed)
 Patient notified

## 2024-07-13 ENCOUNTER — Other Ambulatory Visit: Payer: Self-pay

## 2024-10-08 ENCOUNTER — Ambulatory Visit: Admitting: Internal Medicine
